# Patient Record
Sex: Male | Born: 1985 | Race: White | Hispanic: No | Marital: Married | Smoking: Current every day smoker
Health system: Southern US, Community
[De-identification: ages and names within clinical notes are randomized; demographics above are authoritative.]

## PROBLEM LIST (undated history)

## (undated) DIAGNOSIS — F32A Depression, unspecified: Secondary | ICD-10-CM

## (undated) DIAGNOSIS — F419 Anxiety disorder, unspecified: Secondary | ICD-10-CM

## (undated) DIAGNOSIS — F319 Bipolar disorder, unspecified: Secondary | ICD-10-CM

## (undated) DIAGNOSIS — K219 Gastro-esophageal reflux disease without esophagitis: Secondary | ICD-10-CM

## (undated) DIAGNOSIS — F1020 Alcohol dependence, uncomplicated: Secondary | ICD-10-CM

## (undated) DIAGNOSIS — R519 Headache, unspecified: Secondary | ICD-10-CM

## (undated) DIAGNOSIS — F329 Major depressive disorder, single episode, unspecified: Secondary | ICD-10-CM

## (undated) DIAGNOSIS — R51 Headache: Secondary | ICD-10-CM

## (undated) DIAGNOSIS — R011 Cardiac murmur, unspecified: Secondary | ICD-10-CM

## (undated) DIAGNOSIS — G4730 Sleep apnea, unspecified: Secondary | ICD-10-CM

## (undated) DIAGNOSIS — Z860100 Personal history of colon polyps, unspecified: Secondary | ICD-10-CM

## (undated) DIAGNOSIS — M1990 Unspecified osteoarthritis, unspecified site: Secondary | ICD-10-CM

## (undated) HISTORY — PX: INNER EAR SURGERY: SHX679

## (undated) HISTORY — DX: Anxiety disorder, unspecified: F41.9

## (undated) HISTORY — DX: Major depressive disorder, single episode, unspecified: F32.9

## (undated) HISTORY — DX: Alcohol dependence, uncomplicated: F10.20

## (undated) HISTORY — DX: Sleep apnea, unspecified: G47.30

## (undated) HISTORY — PX: STOMACH SURGERY: SHX791

## (undated) HISTORY — DX: Unspecified osteoarthritis, unspecified site: M19.90

## (undated) HISTORY — DX: Gastro-esophageal reflux disease without esophagitis: K21.9

## (undated) HISTORY — DX: Depression, unspecified: F32.A

## (undated) HISTORY — DX: Personal history of colonic polyps: Z86.010

## (undated) HISTORY — DX: Personal history of colon polyps, unspecified: Z86.0100

## (undated) HISTORY — PX: TONSILLECTOMY: SUR1361

---

## 2016-07-03 LAB — HM COLONOSCOPY

## 2017-09-07 ENCOUNTER — Ambulatory Visit (INDEPENDENT_AMBULATORY_CARE_PROVIDER_SITE_OTHER): Payer: Self-pay

## 2017-09-07 ENCOUNTER — Ambulatory Visit (INDEPENDENT_AMBULATORY_CARE_PROVIDER_SITE_OTHER): Payer: 59 | Admitting: Orthopaedic Surgery

## 2017-09-07 ENCOUNTER — Encounter (INDEPENDENT_AMBULATORY_CARE_PROVIDER_SITE_OTHER): Payer: Self-pay | Admitting: Orthopaedic Surgery

## 2017-09-07 VITALS — BP 133/73 | HR 70 | Ht 72.0 in | Wt 225.0 lb

## 2017-09-07 DIAGNOSIS — M542 Cervicalgia: Secondary | ICD-10-CM | POA: Diagnosis not present

## 2017-09-07 DIAGNOSIS — G8929 Other chronic pain: Secondary | ICD-10-CM | POA: Diagnosis not present

## 2017-09-07 DIAGNOSIS — M5116 Intervertebral disc disorders with radiculopathy, lumbar region: Secondary | ICD-10-CM

## 2017-09-07 DIAGNOSIS — M545 Low back pain: Secondary | ICD-10-CM

## 2017-09-10 ENCOUNTER — Encounter (INDEPENDENT_AMBULATORY_CARE_PROVIDER_SITE_OTHER): Payer: Self-pay | Admitting: Orthopaedic Surgery

## 2017-09-10 DIAGNOSIS — M5116 Intervertebral disc disorders with radiculopathy, lumbar region: Secondary | ICD-10-CM | POA: Insufficient documentation

## 2017-09-10 NOTE — Progress Notes (Signed)
Office Visit Note   Patient: Jeremiah Flores           Date of Birth: [birth year]           MRN: [id number] Visit Date: 09/07/2017              Requested by: No referring provider defined for this encounter. PCP: Patient, No Pcp Per   Assessment & Plan: Visit Diagnoses:  1. Neck pain   2. Chronic right-sided low back pain, with sciatica presence unspecified     Plan: Microdiscectomy at L5-S1.  Will start on the right side and consider bilateral microdiscectomy at L5-S1  if needed.  Discussed operative technique overnight stay in the hospital ,walking program postoperatively ,and avoiding sitting for period of 6 weeks other than for meals are using the bathroom.  Activities discussed risks of recurrence discussed with the recurrence rate of 5-10%.  He understands he may get progression of the disc degeneration with time and there is a potential that at some point in the future fusion might be indicated although this is unlikely.  Questions were elicited and answered he understands and requests we proceed.  Follow-Up Instructions: No Follow-up on file.   Orders:  Orders Placed This Encounter  Procedures  . XR Lumbar Spine 2-3 Views  . XR Cervical Spine 2 or 3 views   No orders of the defined types were placed in this encounter.     Procedures: No procedures performed   Clinical Data: No additional findings.   Subjective: Chief Complaint  Patient presents with  . Lower Back - Pain    HPI 32 year old male states he was in the [employer] for 4 years started having back pain during basic training on told that he was overweight needs to lose weight.  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  He was seen at [place] and was scheduled for microdiscectomy for a  L5-S1 HNP with radiculopathy.  He has had persistent problems with back pain and righ more than left leg weakness numbness and has problems with  stairs.  He is taking gabapentin 900 mg at night also ibuprofen and Naprosyn without relief.  He is had history of therapy, epidural injection, muscle relaxants, anti-inflammatories, gabapentin without relief.  Patient states he had to cancel his surgery since he was moving to [place].  He states he currently is on his husband's insurance and would like to proceed with surgical scheduling due to persistent radicular symptoms from his right L5-S1 HNP.  MRI scan 05/04/2017 showed extruded disc central disc herniation with moderate impression on thecal sac contributing to mild bilateral L5 neural foraminal narrowing 15 pages of [facility] from [facility] are reviewed. 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  Positive for anxiety, arthritis, bronchitis, depression, history of mononucleosis sleep apnea.  Previous surgeries include ear surgery 1999 ,stomach surgery 1988, tonsillectomy 2018.  He takes only praise all for GERD.  Is been on trazodone at night.  He also states he has had some neck problems off and on.  He had been followed previously at the [place].  Patient denies associated bowel or bladder symptoms.  No fever or chills.   Objective: Vital Signs: BP 133/73   Pulse 70   Ht 6' (1.829 m)   Wt 225 lb (102.1 kg)   BMI 30.52 kg/m  Physical Exam  Constitutional: He is oriented to person, place, and time. He appears well-developed and well-nourished.  HENT:  Head: Normocephalic and atraumatic.  Eyes: EOM are normal. Pupils are equal, round, and reactive to light.  Neck: No tracheal deviation present. No thyromegaly present.  Cardiovascular: Normal rate.  Pulmonary/Chest: Effort normal. He has no wheezes.  Abdominal: Soft. Bowel sounds are normal.  Neurological: He is alert and oriented to person, place, and time.  Skin: Skin is warm and dry. Capillary refill takes less than 2 seconds.  Psychiatric: He has a normal mood and  affect. His behavior is normal. Judgment and thought content normal.    Ortho Exam patient has positive straight leg raising 70 degrees on the right.  Knee and ankle jerk are 1+ bilateral negative for clonus.  No brachial plexus tenderness.  More right than left sciatic notch tenderness.  Hamstrings quads are strong.  Normal hip range of motion.  He has weakness with toe walking on the right but can do it on the left.  Specialty Comments:  No specialty comments available.  Imaging: Lumbar MRI scan 05/04/2017 shows extruded disc fragment centrally at L5-S1 with moderate impression on the thecal sac medial to the S1 nerve root origins.  Disc bulge causes bilateral narrowing of the foramina.  Disc at L1-2 through L4-5 are normal.  Shallow central protrusion at T12-L1 without compression.   PMFS History: There are no active problems to display for this patient.  Past Medical History:  Diagnosis Date  . Acid reflux   . Alcoholism ([place])   . Anxiety   . Arthritis   . Depression   . Sleep apnea     History reviewed. No pertinent family history.  Past Surgical History:  Procedure Laterality Date  . TONSILLECTOMY     Social History   Occupational History  . Not on file  Tobacco Use  . Smoking status: Current Every Day Smoker    Years: 9.00  . Smokeless tobacco: Never Used  Substance and Sexual Activity  . Alcohol use: No    Frequency: Never  . Drug use: No  . Sexual activity: Not on file

## 2017-09-11 NOTE — Pre-Procedure Instructions (Signed)
Jeremiah Flores  09/11/2017      [id number] [address], [address] [place] [address] Phone: [phone] Fax: [phone]    Your procedure is scheduled on Monday March 18.  Report to [facility] Admitting at 1:25 P.M.  Call this number if you have problems the morning of surgery:  [phone]   Remember:  Do not eat food or drink liquids after midnight.  Take these medicines the morning of surgery with A SIP OF WATER:   Omeprazole (prilosec) Vilazodone (Viibryd) Baclofen (lioresal) if needed  7 days prior to surgery STOP taking any Aspirin(unless otherwise instructed by your surgeon), diclofenac (voltaren), Aleve, Naproxen, Ibuprofen, Motrin, Advil, Goody's, BC's, all herbal medications, fish oil, and all vitamins    Do not wear jewelry, make-up or nail polish.  Do not wear lotions, powders, or perfumes, or deodorant.  Do not shave 48 hours prior to surgery.  Men may shave face and neck.  Do not bring valuables to the hospital.  [facility] is not responsible for any belongings or valuables.  Contacts, dentures or bridgework may not be worn into surgery.  Leave your suitcase in the car.  After surgery it may be brought to your room.  For patients admitted to the hospital, discharge time will be determined by your treatment team.  Patients discharged the day of surgery will not be allowed to drive home.   Special instructions:    [facility]- Preparing For Surgery  Before surgery, you can play an important role. Because skin is not sterile, your skin needs to be as free of germs as possible. You can reduce the number of germs on your skin by washing with CHG (chlorahexidine gluconate) Soap before surgery.  CHG is an antiseptic cleaner which kills germs and bonds with the skin to continue killing germs even after washing.  Please do not use if you have an allergy to CHG or antibacterial soaps. If your skin  becomes reddened/irritated stop using the CHG.  Do not shave (including legs and underarms) for at least 48 hours prior to first CHG shower. It is OK to shave your face.  Please follow these instructions carefully.   1. Shower the NIGHT BEFORE SURGERY and the MORNING OF SURGERY with CHG.   2. If you chose to wash your hair, wash your hair first as usual with your normal shampoo.  3. After you shampoo, rinse your hair and body thoroughly to remove the shampoo.  4. Use CHG as you would any other liquid soap. You can apply CHG directly to the skin and wash gently with a scrungie or a clean washcloth.   5. Apply the CHG Soap to your body ONLY FROM THE NECK DOWN.  Do not use on open wounds or open sores. Avoid contact with your eyes, ears, mouth and genitals (private parts). Wash Face and genitals (private parts)  with your normal soap.  6. Wash thoroughly, paying special attention to the area where your surgery will be performed.  7. Thoroughly rinse your body with warm water from the neck down.  8. DO NOT shower/wash with your normal soap after using and rinsing off the CHG Soap.  9. Pat yourself dry with a CLEAN TOWEL.  10. Wear CLEAN PAJAMAS to bed the night before surgery, wear comfortable clothes the morning of surgery  11. Place CLEAN SHEETS on your bed the night of your first shower and DO NOT SLEEP WITH PETS.  Day of Surgery: Do not apply any deodorants/lotions. Please wear clean clothes to the hospital/surgery center.      Please read over the following fact sheets that you were given. Coughing and Deep Breathing, MRSA Information and Surgical Site Infection Prevention

## 2017-09-12 ENCOUNTER — Encounter (HOSPITAL_COMMUNITY)
Admission: RE | Admit: 2017-09-12 | Discharge: 2017-09-12 | Disposition: A | Discharge status: Home / Self Care | Payer: 59 | Source: Ambulatory Visit | Attending: Orthopaedic Surgery | Admitting: Orthopaedic Surgery

## 2017-09-12 ENCOUNTER — Other Ambulatory Visit: Payer: Self-pay

## 2017-09-12 ENCOUNTER — Encounter (HOSPITAL_COMMUNITY): Payer: Self-pay

## 2017-09-12 DIAGNOSIS — Z01812 Encounter for preprocedural laboratory examination: Secondary | ICD-10-CM | POA: Diagnosis present

## 2017-09-12 HISTORY — DX: Bipolar disorder, unspecified: F31.9

## 2017-09-12 HISTORY — DX: Headache: R51

## 2017-09-12 HISTORY — DX: Headache, unspecified: R51.9

## 2017-09-12 HISTORY — DX: Cardiac murmur, unspecified: R01.1

## 2017-09-12 LAB — COMPREHENSIVE METABOLIC PANEL
ALBUMIN: 4.3 g/dL (ref 3.5–5.0)
ALK PHOS: 48 U/L (ref 38–126)
ALT: 76 U/L — ABNORMAL HIGH (ref 17–63)
AST: 42 U/L — ABNORMAL HIGH (ref 15–41)
Anion gap: 12 (ref 5–15)
BILIRUBIN TOTAL: 0.5 mg/dL (ref 0.3–1.2)
BUN: 13 mg/dL (ref 6–20)
CALCIUM: 9.5 mg/dL (ref 8.9–10.3)
CO2: 22 mmol/L (ref 22–32)
Chloride: 106 mmol/L (ref 101–111)
Creatinine, Ser: 1.03 mg/dL (ref 0.61–1.24)
GFR calc non Af Amer: >60 mL/min (ref >60)
GLUCOSE: 132 mg/dL — AB (ref 65–99)
Potassium: 4 mmol/L (ref 3.5–5.1)
SODIUM: 140 mmol/L (ref 135–145)
Total Protein: 6.8 g/dL (ref 6.5–8.1)

## 2017-09-12 LAB — CBC
HEMATOCRIT: 44.5 % (ref 39.0–52.0)
HEMOGLOBIN: 15.4 g/dL (ref 13.0–17.0)
MCH: 29.8 pg (ref 26.0–34.0)
MCHC: 34.6 g/dL (ref 30.0–36.0)
MCV: 86.1 fL (ref 78.0–100.0)
Platelets: 316 10*3/uL (ref 150–400)
RBC: 5.17 MIL/uL (ref 4.22–5.81)
RDW: 12.5 % (ref 11.5–15.5)
WBC: 10.1 10*3/uL (ref 4.0–10.5)

## 2017-09-12 LAB — SURGICAL PCR SCREEN
MRSA, PCR: NEGATIVE
Staphylococcus aureus: NEGATIVE

## 2017-09-12 NOTE — Pre-Procedure Instructions (Signed)
UPTON RUSSEY  09/12/2017      [id number] - [address], [address] [place] [address] Phone: [phone] Fax: [phone]    Your procedure is scheduled on Monday March 18.  Report to [facility] Admitting at 1030 A.M.  Call this number if you have problems the morning of surgery:  [phone]   Remember:  Do not eat food or drink liquids after midnight.  Take these medicines the morning of surgery with A SIP OF WATER:   Omeprazole (prilosec) Vilazodone (Viibryd) Baclofen (lioresal) if needed  7 days prior to surgery STOP taking any Aspirin(unless otherwise instructed by your surgeon), diclofenac (voltaren), Aleve, Naproxen, Ibuprofen, Motrin, Advil, Goody's, BC's, all herbal medications, fish oil, and all vitamins    Do not wear jewelry, make-up or nail polish.  Do not wear lotions, powders, or perfumes, or deodorant.  Do not shave 48 hours prior to surgery.  Men may shave face and neck.  Do not bring valuables to the hospital.  [facility] is not responsible for any belongings or valuables.  Contacts, dentures or bridgework may not be worn into surgery.  Leave your suitcase in the car.  After surgery it may be brought to your room.  For patients admitted to the hospital, discharge time will be determined by your treatment team.  Patients discharged the day of surgery will not be allowed to drive home.   Special instructions:    [facility]- Preparing For Surgery  Before surgery, you can play an important role. Because skin is not sterile, your skin needs to be as free of germs as possible. You can reduce the number of germs on your skin by washing with CHG (chlorahexidine gluconate) Soap before surgery.  CHG is an antiseptic cleaner which kills germs and bonds with the skin to continue killing germs even after washing.  Please do not use if you have an allergy to CHG or antibacterial soaps. If your skin  becomes reddened/irritated stop using the CHG.  Do not shave (including legs and underarms) for at least 48 hours prior to first CHG shower. It is OK to shave your face.  Please follow these instructions carefully.   1. Shower the NIGHT BEFORE SURGERY and the MORNING OF SURGERY with CHG.   2. If you chose to wash your hair, wash your hair first as usual with your normal shampoo.  3. After you shampoo, rinse your hair and body thoroughly to remove the shampoo.  4. Use CHG as you would any other liquid soap. You can apply CHG directly to the skin and wash gently with a scrungie or a clean washcloth.   5. Apply the CHG Soap to your body ONLY FROM THE NECK DOWN.  Do not use on open wounds or open sores. Avoid contact with your eyes, ears, mouth and genitals (private parts). Wash Face and genitals (private parts)  with your normal soap.  6. Wash thoroughly, paying special attention to the area where your surgery will be performed.  7. Thoroughly rinse your body with warm water from the neck down.  8. DO NOT shower/wash with your normal soap after using and rinsing off the CHG Soap.  9. Pat yourself dry with a CLEAN TOWEL.  10. Wear CLEAN PAJAMAS to bed the night before surgery, wear comfortable clothes the morning of surgery  11. Place CLEAN SHEETS on your bed the night of your first shower and DO NOT SLEEP WITH  PETS.    Day of Surgery: Do not apply any deodorants/lotions. Please wear clean clothes to the hospital/surgery center.      Please read over the following fact sheets that you were given. Coughing and Deep Breathing, MRSA Information and Surgical Site Infection Prevention

## 2017-09-17 ENCOUNTER — Ambulatory Visit (HOSPITAL_COMMUNITY): Payer: 59 | Admitting: Anesthesiology

## 2017-09-17 ENCOUNTER — Ambulatory Visit (HOSPITAL_COMMUNITY): Payer: 59 | Admitting: Emergency Medicine

## 2017-09-17 ENCOUNTER — Other Ambulatory Visit: Payer: Self-pay

## 2017-09-17 ENCOUNTER — Encounter (HOSPITAL_COMMUNITY)
Admission: RE | Disposition: A | Discharge status: Home / Self Care | Payer: Self-pay | Source: Ambulatory Visit | Attending: Orthopaedic Surgery

## 2017-09-17 ENCOUNTER — Observation Stay (HOSPITAL_COMMUNITY)
Admission: RE | Admit: 2017-09-17 | Discharge: 2017-09-18 | Disposition: A | Discharge status: Home / Self Care | Payer: 59 | Source: Ambulatory Visit | Attending: Orthopaedic Surgery | Admitting: Orthopaedic Surgery

## 2017-09-17 ENCOUNTER — Ambulatory Visit (HOSPITAL_COMMUNITY): Payer: 59

## 2017-09-17 ENCOUNTER — Encounter (HOSPITAL_COMMUNITY): Payer: Self-pay

## 2017-09-17 DIAGNOSIS — F319 Bipolar disorder, unspecified: Secondary | ICD-10-CM | POA: Diagnosis not present

## 2017-09-17 DIAGNOSIS — K219 Gastro-esophageal reflux disease without esophagitis: Secondary | ICD-10-CM | POA: Diagnosis not present

## 2017-09-17 DIAGNOSIS — M5126 Other intervertebral disc displacement, lumbar region: Secondary | ICD-10-CM | POA: Diagnosis present

## 2017-09-17 DIAGNOSIS — Z79899 Other long term (current) drug therapy: Secondary | ICD-10-CM | POA: Diagnosis not present

## 2017-09-17 DIAGNOSIS — M5117 Intervertebral disc disorders with radiculopathy, lumbosacral region: Secondary | ICD-10-CM | POA: Diagnosis not present

## 2017-09-17 DIAGNOSIS — F419 Anxiety disorder, unspecified: Secondary | ICD-10-CM | POA: Insufficient documentation

## 2017-09-17 DIAGNOSIS — Z791 Long term (current) use of non-steroidal anti-inflammatories (NSAID): Secondary | ICD-10-CM | POA: Diagnosis not present

## 2017-09-17 DIAGNOSIS — M5137 Other intervertebral disc degeneration, lumbosacral region: Secondary | ICD-10-CM | POA: Diagnosis not present

## 2017-09-17 DIAGNOSIS — G4730 Sleep apnea, unspecified: Secondary | ICD-10-CM | POA: Diagnosis not present

## 2017-09-17 DIAGNOSIS — F17200 Nicotine dependence, unspecified, uncomplicated: Secondary | ICD-10-CM | POA: Insufficient documentation

## 2017-09-17 DIAGNOSIS — Z419 Encounter for procedure for purposes other than remedying health state, unspecified: Secondary | ICD-10-CM

## 2017-09-17 HISTORY — PX: LUMBAR LAMINECTOMY/DECOMPRESSION MICRODISCECTOMY: SHX5026

## 2017-09-17 SURGERY — LUMBAR LAMINECTOMY/DECOMPRESSION MICRODISCECTOMY
Anesthesia: General

## 2017-09-17 MED ORDER — CHLORHEXIDINE GLUCONATE 4 % EX LIQD: 60.0000 mL | Freq: Once | CUTANEOUS | Status: DC

## 2017-09-17 MED ORDER — PROPOFOL 10 MG/ML IV BOLUS
INTRAVENOUS | Status: DC | PRN
  Administered 2017-09-17: 200 mg via INTRAVENOUS

## 2017-09-17 MED ORDER — BUPIVACAINE HCL (PF) 0.25 % IJ SOLN
INTRAMUSCULAR | Status: DC | PRN
  Administered 2017-09-17: 5 mL

## 2017-09-17 MED ORDER — SODIUM CHLORIDE 0.9 % IV SOLN: INTRAVENOUS | Status: DC

## 2017-09-17 MED ORDER — BUPIVACAINE HCL (PF) 0.25 % IJ SOLN
INTRAMUSCULAR | Status: AC
  Filled 2017-09-17: qty 30

## 2017-09-17 MED ORDER — HYDROMORPHONE HCL 1 MG/ML IJ SOLN
INTRAMUSCULAR | Status: AC
  Administered 2017-09-17: 0.5 mg via INTRAVENOUS
  Filled 2017-09-17: qty 1

## 2017-09-17 MED ORDER — 0.9 % SODIUM CHLORIDE (POUR BTL) OPTIME
TOPICAL | Status: DC | PRN
  Administered 2017-09-17: 1000 mL

## 2017-09-17 MED ORDER — TRAZODONE HCL 100 MG PO TABS
100.0000 mg | ORAL_TABLET | Freq: Every day | ORAL | Status: DC
  Administered 2017-09-17: 100 mg via ORAL
  Filled 2017-09-17: qty 1

## 2017-09-17 MED ORDER — MIDAZOLAM HCL 2 MG/2ML IJ SOLN
INTRAMUSCULAR | Status: AC
  Filled 2017-09-17: qty 2

## 2017-09-17 MED ORDER — HYDROMORPHONE HCL 1 MG/ML IJ SOLN
0.2500 mg | INTRAMUSCULAR | Status: DC | PRN
  Administered 2017-09-17 (×2): 0.5 mg via INTRAVENOUS

## 2017-09-17 MED ORDER — LIDOCAINE 2% (20 MG/ML) 5 ML SYRINGE
INTRAMUSCULAR | Status: DC | PRN
  Administered 2017-09-17: 60 mg via INTRAVENOUS

## 2017-09-17 MED ORDER — LACTATED RINGERS IV SOLN
INTRAVENOUS | Status: DC
  Administered 2017-09-17 (×3): via INTRAVENOUS

## 2017-09-17 MED ORDER — METHOCARBAMOL 1000 MG/10ML IJ SOLN
500.0000 mg | Freq: Four times a day (QID) | INTRAVENOUS | Status: DC | PRN
  Filled 2017-09-17: qty 5

## 2017-09-17 MED ORDER — DEXAMETHASONE SODIUM PHOSPHATE 10 MG/ML IJ SOLN
INTRAMUSCULAR | Status: DC | PRN
  Administered 2017-09-17: 10 mg via INTRAVENOUS

## 2017-09-17 MED ORDER — ACETAMINOPHEN 650 MG RE SUPP: 650.0000 mg | RECTAL | Status: DC | PRN

## 2017-09-17 MED ORDER — MIDAZOLAM HCL 5 MG/5ML IJ SOLN
INTRAMUSCULAR | Status: DC | PRN
  Administered 2017-09-17: 2 mg via INTRAVENOUS

## 2017-09-17 MED ORDER — SODIUM CHLORIDE 0.9% FLUSH
3.0000 mL | Freq: Two times a day (BID) | INTRAVENOUS | Status: DC
  Administered 2017-09-17: 3 mL via INTRAVENOUS

## 2017-09-17 MED ORDER — FENTANYL CITRATE (PF) 250 MCG/5ML IJ SOLN
INTRAMUSCULAR | Status: AC
  Filled 2017-09-17: qty 5

## 2017-09-17 MED ORDER — CEFAZOLIN SODIUM 1 G IJ SOLR
INTRAMUSCULAR | Status: AC
  Filled 2017-09-17: qty 60

## 2017-09-17 MED ORDER — CEFAZOLIN SODIUM-DEXTROSE 2-4 GM/100ML-% IV SOLN
2.0000 g | INTRAVENOUS | Status: AC
  Administered 2017-09-17: 2 g via INTRAVENOUS
  Filled 2017-09-17: qty 100

## 2017-09-17 MED ORDER — STERILE WATER FOR IRRIGATION IR SOLN
Status: DC | PRN
  Administered 2017-09-17: 1000 mL

## 2017-09-17 MED ORDER — PANTOPRAZOLE SODIUM 40 MG PO TBEC
40.0000 mg | DELAYED_RELEASE_TABLET | Freq: Every day | ORAL | Status: DC
  Administered 2017-09-18: 40 mg via ORAL
  Filled 2017-09-17: qty 1

## 2017-09-17 MED ORDER — BUPIVACAINE-EPINEPHRINE (PF) 0.5% -1:200000 IJ SOLN: INTRAMUSCULAR | Status: DC | PRN

## 2017-09-17 MED ORDER — ROCURONIUM BROMIDE 10 MG/ML (PF) SYRINGE
PREFILLED_SYRINGE | INTRAVENOUS | Status: DC | PRN
  Administered 2017-09-17: 50 mg via INTRAVENOUS

## 2017-09-17 MED ORDER — OXYCODONE HCL 5 MG PO TABS
ORAL_TABLET | ORAL | Status: AC
  Filled 2017-09-17: qty 1

## 2017-09-17 MED ORDER — GABAPENTIN 300 MG PO CAPS
900.0000 mg | ORAL_CAPSULE | Freq: Every day | ORAL | Status: DC
  Administered 2017-09-17: 900 mg via ORAL
  Filled 2017-09-17: qty 3

## 2017-09-17 MED ORDER — QUETIAPINE 12.5 MG HALF TABLET
12.5000 mg | ORAL_TABLET | Freq: Every evening | ORAL | Status: DC | PRN
  Filled 2017-09-17: qty 1

## 2017-09-17 MED ORDER — SUGAMMADEX SODIUM 200 MG/2ML IV SOLN
INTRAVENOUS | Status: DC | PRN
  Administered 2017-09-17: 200 mg via INTRAVENOUS

## 2017-09-17 MED ORDER — MENTHOL 3 MG MT LOZG: 1.0000 | LOZENGE | OROMUCOSAL | Status: DC | PRN

## 2017-09-17 MED ORDER — PROPOFOL 10 MG/ML IV BOLUS
INTRAVENOUS | Status: AC
  Filled 2017-09-17: qty 40

## 2017-09-17 MED ORDER — CEFAZOLIN SODIUM-DEXTROSE 1-4 GM/50ML-% IV SOLN
1.0000 g | Freq: Three times a day (TID) | INTRAVENOUS | Status: AC
  Administered 2017-09-17 – 2017-09-18 (×2): 1 g via INTRAVENOUS
  Filled 2017-09-17 (×2): qty 50

## 2017-09-17 MED ORDER — SODIUM CHLORIDE 0.9% FLUSH: 3.0000 mL | INTRAVENOUS | Status: DC | PRN

## 2017-09-17 MED ORDER — ONDANSETRON HCL 4 MG PO TABS: 4.0000 mg | ORAL_TABLET | Freq: Four times a day (QID) | ORAL | Status: DC | PRN

## 2017-09-17 MED ORDER — METHOCARBAMOL 500 MG PO TABS
500.0000 mg | ORAL_TABLET | Freq: Four times a day (QID) | ORAL | Status: DC | PRN
  Administered 2017-09-17 (×2): 500 mg via ORAL
  Filled 2017-09-17: qty 1

## 2017-09-17 MED ORDER — OXYCODONE-ACETAMINOPHEN 5-325 MG PO TABS
1.0000 | ORAL_TABLET | ORAL | Status: DC | PRN
  Administered 2017-09-17 – 2017-09-18 (×3): 2 via ORAL
  Filled 2017-09-17 (×3): qty 2

## 2017-09-17 MED ORDER — ACETAMINOPHEN 325 MG PO TABS: 650.0000 mg | ORAL_TABLET | ORAL | Status: DC | PRN

## 2017-09-17 MED ORDER — METHOCARBAMOL 500 MG PO TABS
ORAL_TABLET | ORAL | Status: AC
  Administered 2017-09-17: 500 mg via ORAL
  Filled 2017-09-17: qty 1

## 2017-09-17 MED ORDER — SUGAMMADEX SODIUM 200 MG/2ML IV SOLN
INTRAVENOUS | Status: AC
Start: 2017-09-17 — End: ?
  Filled 2017-09-17: qty 2

## 2017-09-17 MED ORDER — PHENOL 1.4 % MT LIQD: 1.0000 | OROMUCOSAL | Status: DC | PRN

## 2017-09-17 MED ORDER — HYDROMORPHONE HCL 1 MG/ML IJ SOLN
0.5000 mg | INTRAMUSCULAR | Status: DC | PRN
  Administered 2017-09-17: 1 mg via INTRAVENOUS
  Filled 2017-09-17: qty 1

## 2017-09-17 MED ORDER — DOCUSATE SODIUM 100 MG PO CAPS
100.0000 mg | ORAL_CAPSULE | Freq: Two times a day (BID) | ORAL | Status: DC
  Administered 2017-09-17 – 2017-09-18 (×2): 100 mg via ORAL
  Filled 2017-09-17 (×2): qty 1

## 2017-09-17 MED ORDER — FENTANYL CITRATE (PF) 250 MCG/5ML IJ SOLN
INTRAMUSCULAR | Status: DC | PRN
  Administered 2017-09-17 (×3): 50 ug via INTRAVENOUS
  Administered 2017-09-17: 100 ug via INTRAVENOUS

## 2017-09-17 MED ORDER — OXYCODONE HCL 5 MG PO TABS
5.0000 mg | ORAL_TABLET | ORAL | Status: DC | PRN
  Administered 2017-09-17: 5 mg via ORAL

## 2017-09-17 MED ORDER — VILAZODONE HCL 20 MG PO TABS
20.0000 mg | ORAL_TABLET | Freq: Every day | ORAL | Status: DC
  Administered 2017-09-18: 20 mg via ORAL
  Filled 2017-09-17: qty 1

## 2017-09-17 MED ORDER — ONDANSETRON HCL 4 MG/2ML IJ SOLN
INTRAMUSCULAR | Status: DC | PRN
  Administered 2017-09-17: 4 mg via INTRAVENOUS

## 2017-09-17 MED ORDER — ONDANSETRON HCL 4 MG/2ML IJ SOLN: 4.0000 mg | Freq: Four times a day (QID) | INTRAMUSCULAR | Status: DC | PRN

## 2017-09-17 SURGICAL SUPPLY — 40 items
BUR ROUND FLUTED 4 SOFT TCH (BURR) IMPLANT
CANISTER SUCT 3000ML PPV (MISCELLANEOUS) ×2 IMPLANT
CLSR STERI-STRIP ANTIMIC 1/2X4 (GAUZE/BANDAGES/DRESSINGS) ×2 IMPLANT
COVER SURGICAL LIGHT HANDLE (MISCELLANEOUS) ×2 IMPLANT
DECANTER SPIKE VIAL GLASS SM (MISCELLANEOUS) ×2 IMPLANT
DRAPE HALF SHEET 40X57 (DRAPES) ×4 IMPLANT
DRAPE MICROSCOPE LEICA (MISCELLANEOUS) ×2 IMPLANT
DRAPE SURG 17X23 STRL (DRAPES) ×2 IMPLANT
DRSG MEPILEX BORDER 4X4 (GAUZE/BANDAGES/DRESSINGS) ×2 IMPLANT
DURAPREP 26ML APPLICATOR (WOUND CARE) ×2 IMPLANT
ELECT REM PT RETURN 9FT ADLT (ELECTROSURGICAL) ×2
ELECTRODE REM PT RTRN 9FT ADLT (ELECTROSURGICAL) ×1 IMPLANT
GLOVE BIOGEL PI IND STRL 8 (GLOVE) ×2 IMPLANT
GLOVE BIOGEL PI INDICATOR 8 (GLOVE) ×2
GLOVE ORTHO TXT STRL SZ7.5 (GLOVE) ×4 IMPLANT
GOWN STRL REUS W/ TWL LRG LVL3 (GOWN DISPOSABLE) ×2 IMPLANT
GOWN STRL REUS W/ TWL XL LVL3 (GOWN DISPOSABLE) ×1 IMPLANT
GOWN STRL REUS W/TWL 2XL LVL3 (GOWN DISPOSABLE) ×2 IMPLANT
GOWN STRL REUS W/TWL LRG LVL3 (GOWN DISPOSABLE) ×2
GOWN STRL REUS W/TWL XL LVL3 (GOWN DISPOSABLE) ×1
KIT BASIN OR (CUSTOM PROCEDURE TRAY) ×2 IMPLANT
KIT ROOM TURNOVER OR (KITS) ×2 IMPLANT
MANIFOLD NEPTUNE II (INSTRUMENTS) ×2 IMPLANT
MATRIX HEMOSTAT SURGIFLO (HEMOSTASIS) ×2 IMPLANT
NEEDLE HYPO 25GX1X1/2 BEV (NEEDLE) ×2 IMPLANT
NEEDLE SPNL 18GX3.5 QUINCKE PK (NEEDLE) ×2 IMPLANT
NS IRRIG 1000ML POUR BTL (IV SOLUTION) ×2 IMPLANT
PACK LAMINECTOMY ORTHO (CUSTOM PROCEDURE TRAY) ×2 IMPLANT
PAD ARMBOARD 7.5X6 YLW CONV (MISCELLANEOUS) ×4 IMPLANT
PATTIES SURGICAL .5 X.5 (GAUZE/BANDAGES/DRESSINGS) IMPLANT
PATTIES SURGICAL .75X.75 (GAUZE/BANDAGES/DRESSINGS) IMPLANT
SUT VIC AB 0 CT1 27 (SUTURE)
SUT VIC AB 0 CT1 27XBRD ANBCTR (SUTURE) IMPLANT
SUT VIC AB 1 CTX 36 (SUTURE) ×1
SUT VIC AB 1 CTX36XBRD ANBCTR (SUTURE) ×1 IMPLANT
SUT VIC AB 2-0 CT1 27 (SUTURE) ×1
SUT VIC AB 2-0 CT1 TAPERPNT 27 (SUTURE) ×1 IMPLANT
SUT VIC AB 3-0 X1 27 (SUTURE) ×2 IMPLANT
TOWEL OR 17X24 6PK STRL BLUE (TOWEL DISPOSABLE) ×2 IMPLANT
TOWEL OR 17X26 10 PK STRL BLUE (TOWEL DISPOSABLE) ×2 IMPLANT

## 2017-09-17 NOTE — Anesthesia Preprocedure Evaluation (Addendum)
Anesthesia Evaluation  Patient identified by MRN, date of birth, ID band Patient awake    Reviewed: Allergy & Precautions, H&P , NPO status , Patient's Chart, lab work & pertinent test results  Airway Mallampati: II  TM Distance: >3 FB Neck ROM: Full    Dental no notable dental hx. (+) Teeth Intact, Dental Advisory Given   Pulmonary sleep apnea and Continuous Positive Airway Pressure Ventilation , Current Smoker,    Pulmonary exam normal breath sounds clear to auscultation       Cardiovascular negative cardio ROS   Rhythm:Regular Rate:Normal     Neuro/Psych  Headaches, Anxiety Depression Bipolar Disorder    GI/Hepatic Neg liver ROS, GERD  Medicated and Controlled,  Endo/Other  negative endocrine ROS  Renal/GU negative Renal ROS  negative genitourinary   Musculoskeletal  (+) Arthritis , Osteoarthritis,    Abdominal   Peds  Hematology negative hematology ROS (+)   Anesthesia Other Findings   Reproductive/Obstetrics negative OB ROS                            Anesthesia Physical Anesthesia Plan  ASA: III  Anesthesia Plan: General   Post-op Pain Management:    Induction: Intravenous  PONV Risk Score and Plan: 2 and Ondansetron, Dexamethasone and Midazolam  Airway Management Planned: Oral ETT  Additional Equipment:   Intra-op Plan:   Post-operative Plan: Extubation in OR  Informed Consent: I have reviewed the patients History and Physical, chart, labs and discussed the procedure including the risks, benefits and alternatives for the proposed anesthesia with the patient or authorized representative who has indicated his/her understanding and acceptance.   Dental advisory given  Plan Discussed with: CRNA  Anesthesia Plan Comments:         Anesthesia Quick Evaluation

## 2017-09-17 NOTE — Transfer of Care (Signed)
Immediate Anesthesia Transfer of Care Note  Patient: Jeremiah Flores  Procedure(s) Performed: RIGHT L5-S1 MICRODISCECTOMY (N/A )  Patient Location: PACU  Anesthesia Type:General  Level of Consciousness: drowsy and patient cooperative  Airway & Oxygen Therapy: Patient Spontanous Breathing and Patient connected to nasal cannula oxygen  Post-op Assessment: Report given to RN and Post -op Vital signs reviewed and stable  Post vital signs: Reviewed and stable  Last Vitals:  Vitals:   09/17/17 1036 09/17/17 1445  BP: (!) 149/78   Pulse: 61 (P) 91  Resp: 20 (P) 13  Temp: 36.9 C (P) 36.8 C  SpO2: 97% (P) 97%    Last Pain:  Vitals:   09/17/17 1106  TempSrc:   PainSc: 0-No pain      Patients Stated Pain Goal: 3 (60/47/99 8721)  Complications: No apparent anesthesia complications

## 2017-09-17 NOTE — H&P (Signed)
Signed            [] Hide copied text  [] Hover for details     Office Visit Note/ History and PE              Patient: Jeremiah Flores                                        Date of Birth: 1986-04-30                                                     MRN: [id number] Visit Date: 09/07/2017                                                                     Requested by: No referring provider defined for this encounter. PCP: Patient, No Pcp Per   Assessment & Plan: Visit Diagnoses:  1. Neck pain   2. Chronic right-sided low back pain, with sciatica presence unspecified     Plan: Microdiscectomy at L5-S1.  Will start on the right side and consider bilateral microdiscectomy at L5-S1  if needed.  Discussed operative technique overnight stay in the hospital ,walking program postoperatively ,and avoiding sitting for period of 6 weeks other than for meals are using the bathroom.  Activities discussed risks of recurrence discussed with the recurrence rate of 5-10%.  He understands he may get progression of the disc degeneration with time and there is a potential that at some point in the future fusion might be indicated although this is unlikely.  Questions were elicited and answered he understands and requests we proceed.  Follow-Up Instructions: No Follow-up on file.   Orders:     Orders Placed This Encounter  Procedures  . XR Lumbar Spine 2-3 Views  . XR Cervical Spine 2 or 3 views   No orders of the defined types were placed in this encounter.     Procedures: No procedures performed   Clinical Data: No additional findings.   Subjective: Chief Complaint  Patient presents with  . Lower Back - Pain    HPI 32 year old male states he was in the [employer] for 4 years started having back pain during basic training on told that he was overweight needs to lose weight.  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  He was seen at [place] and was scheduled for microdiscectomy for a  L5-S1 HNP with radiculopathy.  He has had persistent problems with back pain and righ more than left leg weakness numbness and has problems with stairs.  He is taking gabapentin 900 mg at night also ibuprofen and Naprosyn without relief.  He is had history of therapy, epidural injection, muscle relaxants, anti-inflammatories, gabapentin without relief.  Patient states he had to cancel his surgery since he was moving to [place].  He states he currently is on his husband's insurance and would like to proceed with surgical  scheduling due to persistent radicular symptoms from his right L5-S1 HNP.  MRI scan 05/04/2017 showed extruded disc central disc herniation with moderate impression on thecal sac contributing to mild bilateral L5 neural foraminal narrowing 15 pages of notes from [facility] are reviewed. 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  Positive for anxiety, arthritis, bronchitis, depression, history of mononucleosis sleep apnea.  Previous surgeries include ear surgery 1999 ,stomach surgery 1988, tonsillectomy 2018.  He takes only praise all for GERD.  Is been on trazodone at night.  He also states he has had some neck problems off and on.  He had been followed previously at the [place].  Patient denies associated bowel or bladder symptoms.  No fever or chills.   Objective: Vital Signs: BP 133/73   Pulse 70   Ht 6' (1.829 m)   Wt 225 lb (102.1 kg)   BMI 30.52 kg/m   Physical Exam  Constitutional: He is oriented to person, place, and time. He appears well-developed and well-nourished.  HENT:  Head: Normocephalic and atraumatic.  Eyes: EOM are normal. Pupils are equal, round, and reactive to light.  Neck: No tracheal deviation present. No thyromegaly present.  Cardiovascular: Normal rate.  Pulmonary/Chest: Effort  normal. He has no wheezes.  Abdominal: Soft. Bowel sounds are normal.  Neurological: He is alert and oriented to person, place, and time.  Skin: Skin is warm and dry. Capillary refill takes less than 2 seconds.  Psychiatric: He has a normal mood and affect. His behavior is normal. Judgment and thought content normal.    Ortho Exam patient has positive straight leg raising 70 degrees on the right.  Knee and ankle jerk are 1+ bilateral negative for clonus.  No brachial plexus tenderness.  More right than left sciatic notch tenderness.  Hamstrings quads are strong.  Normal hip range of motion.  He has weakness with toe walking on the right but can do it on the left.  Specialty Comments:  No specialty comments available.  Imaging: Lumbar MRI scan 05/04/2017 shows extruded disc fragment centrally at L5-S1 with moderate impression on the thecal sac medial to the S1 nerve root origins.  Disc bulge causes bilateral narrowing of the foramina.  Disc at L1-2 through L4-5 are normal.  Shallow central protrusion at T12-L1 without compression.   PMFS History: There are no active problems to display for this patient.      Past Medical History:  Diagnosis Date  . Acid reflux   . Alcoholism ([place])   . Anxiety   . Arthritis   . Depression   . Sleep apnea     History reviewed. No pertinent family history.       Past Surgical History:  Procedure Laterality Date  . TONSILLECTOMY     Social History        Occupational History  . Not on file  Tobacco Use  . Smoking status: Current Every Day Smoker    Years: 9.00  . Smokeless tobacco: Never Used  Substance and Sexual Activity  . Alcohol use: No    Frequency: Never  . Drug use: No  . Sexual activity: Not on file

## 2017-09-17 NOTE — Interval H&P Note (Signed)
History and Physical Interval Note:  09/17/2017 12:16 PM  Jeremiah Flores  has presented today for surgery, with the diagnosis of L5-S1 HERNIATED NUCLEUS PULPOSUS  The various methods of treatment have been discussed with the patient and family. After consideration of risks, benefits and other options for treatment, the patient has consented to  Procedure(s): RIGHT L5-S1 MICRODISCECTOMY POSSIBLE BILATERAL (N/A) as a surgical intervention .  The patient's history has been reviewed, patient examined, no change in status, stable for surgery.  I have reviewed the patient's chart and labs.  Questions were answered to the patient's satisfaction.     Jeremiah Flores

## 2017-09-17 NOTE — Op Note (Signed)
Preop diagnosis: Right L5-S1 herniated nucleus pulposus  Postop diagnosis: Same  Procedure: Right L5-S1 microdiscectomy  Surgeon: Rodell Perna MD  Assistant: Benjiman Core PA-C medically necessary and present for the entire procedure  Anesthesia: General plus Marcaine local  EBL 101BP  Complications: None  Procedure after standard prepping draping with patient in prone position preoperative antibiotics timeout procedure DuraPrep dried area squared with towels Betadine Steri-Drape applied and laminectomy sheet and draped.  Needle localization with spinal needle was performed.  There is a delay in radiology arrival for portable imaging and incision was made based on palpable landmarks 1-2 mm to the right side at L5-S1.  Subperiosteal dissection onto the lamina self-retaining McCulloch retractor placed and a [place] 4 taken and placed at the interlaminar space between L5 and S1 confirmed with the lateral radiograph at the appropriate level.  Laminotomy was performed in the right at L5 a 4 mm bur was used to remove a portion of the overhanging facet and lateral wall.  Bone was removed at the level of the pedicle.  Thick chunks of ligamentum were removed.  Gentle retraction of the nerve and dura toward the midline with large disc protrusion present which was central and right paracentral.  Annulus was incised in multiple pieces of disc were teased out using the micropituitary and up up pituitary micro.  We progressed to regular pituitary use of the Epstein curette.  Chunks of disc removed and the D'Errico was used to push down on the disc space pushing fragments toward the midline of the disc and then removing them with the up-biting pituitary.  Once the chunks were decompressed dura was free disc was flat no longer protruding midline I was able to reach across with the D'Errico for the opposite wall with no areas of compression.  Patient had been permitted for possible bilateral microdiscectomy but with  good decompression on the right side and patient absent left-sided symptoms before the surgery we did right side only.  Nerve root was followed out underneath the pedicle and was loose.  No extruded fragments were present.  Some Surgi-Flo was placed in the lateral gutter and after a few minutes removed it was repeated once epidural space was dry.  Bipolar cautery was used on some epidural veins.  Standard layer closure with #1 Vicryl 2-0 Vicryl subcuticular skin closure tincture benzoin Steri-Strips after Dermabond had been applied and dressing.

## 2017-09-17 NOTE — Anesthesia Procedure Notes (Signed)
Procedure Name: Intubation Date/Time: 09/17/2017 1:07 PM Performed by: Renato Shin, CRNA Pre-anesthesia Checklist: Patient identified, Emergency Drugs available, Suction available and Patient being monitored Patient Re-evaluated:Patient Re-evaluated prior to induction Oxygen Delivery Method: Circle system utilized Preoxygenation: Pre-oxygenation with 100% oxygen Induction Type: IV induction Ventilation: Mask ventilation without difficulty Laryngoscope Size: Miller and 3 Grade View: Grade I Tube type: Oral Tube size: 7.5 mm Number of attempts: 1 Airway Equipment and Method: Stylet Placement Confirmation: ETT inserted through vocal cords under direct vision,  positive ETCO2 and CO2 detector Secured at: 21 cm Tube secured with: Tape Dental Injury: Teeth and Oropharynx as per pre-operative assessment

## 2017-09-18 ENCOUNTER — Encounter (HOSPITAL_COMMUNITY): Payer: Self-pay | Admitting: Orthopaedic Surgery

## 2017-09-18 DIAGNOSIS — M5117 Intervertebral disc disorders with radiculopathy, lumbosacral region: Secondary | ICD-10-CM | POA: Diagnosis not present

## 2017-09-18 MED ORDER — OXYCODONE-ACETAMINOPHEN 5-325 MG PO TABS: 1.0000 | ORAL_TABLET | ORAL | 0 refills | Status: DC | PRN

## 2017-09-18 NOTE — Progress Notes (Signed)
   Subjective: 1 Day Post-Op Procedure(s) (LRB): RIGHT L5-S1 MICRODISCECTOMY (N/A) Patient reports pain as mild.  " my leg pain is gone"  Objective: Vital signs in last 24 hours: Temp:  [97.7 F (36.5 C)-98.4 F (36.9 C)] 97.8 F (36.6 C) (03/19 0400) Pulse Rate:  [61-91] 71 (03/19 0400) Resp:  [8-20] 16 (03/19 0400) BP: (114-153)/(57-88) 114/57 (03/19 0400) SpO2:  [95 %-98 %] 96 % (03/19 0400) Weight:  [224 lb (101.6 kg)] 224 lb (101.6 kg) (03/18 1036)  Intake/Output from previous day: 03/18 0701 - 03/19 0700 In: 2080 [P.O.:480; I.V.:1600] Out: 100 [Blood:100] Intake/Output this shift: No intake/output data recorded.  No results for input(s): HGB in the last 72 hours. No results for input(s): WBC, RBC, HCT, PLT in the last 72 hours. No results for input(s): NA, K, CL, CO2, BUN, CREATININE, GLUCOSE, CALCIUM in the last 72 hours. No results for input(s): LABPT, INR in the last 72 hours.  Neurologically intact Dg Lumbar Spine 1 View  Result Date: 09/17/2017 CLINICAL DATA:  Lumbar microdiskectomy. EXAM: LUMBAR SPINE - 1 VIEW COMPARISON:  Lumbar radiographs 09/07/2017 FINDINGS: A single portable cross-table lateral radiograph of the lumbar spine is provided. Comparison radiographs demonstrate 5 non rib-bearing lumbar type vertebrae. The lowest fully formed intervertebral disc space is designated L5-S1. A metallic surgical instrument projects over the inferior aspect of the L5 spinous process, directed towards the L5-S1 disc space level. IMPRESSION: Intraoperative localization as above. Electronically Signed   By: Logan Bores M.D.   On: 09/17/2017 14:52    Assessment/Plan: 1 Day Post-Op Procedure(s) (LRB): RIGHT L5-S1 MICRODISCECTOMY (N/A) Plan: discharge home.   Marybelle Killings 09/18/2017, 7:50 AM

## 2017-09-18 NOTE — Discharge Instructions (Signed)
Ok to shower , office one week. Avoid sitting except for toilet and meals. Walk daily , goal of walking 2 miles

## 2017-09-18 NOTE — Anesthesia Postprocedure Evaluation (Signed)
Anesthesia Post Note  Patient: Jeremiah Flores  Procedure(s) Performed: RIGHT L5-S1 MICRODISCECTOMY (N/A )     Patient location during evaluation: PACU Anesthesia Type: General Level of consciousness: awake and alert Pain management: pain level controlled Vital Signs Assessment: post-procedure vital signs reviewed and stable Respiratory status: spontaneous breathing, nonlabored ventilation and respiratory function stable Cardiovascular status: blood pressure returned to baseline and stable Postop Assessment: no apparent nausea or vomiting Anesthetic complications: no    Last Vitals:  Vitals:   09/17/17 2320 09/18/17 0400  BP: 126/75 (!) 114/57  Pulse: 75 71  Resp: 16 16  Temp: 36.8 C 36.6 C  SpO2: 96% 96%    Last Pain:  Vitals:   09/18/17 0405  TempSrc:   PainSc: 2                  Mikeila Burgen,W. EDMOND

## 2017-09-18 NOTE — Evaluation (Signed)
Physical Therapy Evaluation and Discharge Patient Details Name: Jeremiah Flores MRN: [id number] DOB: Apr 22, 1986 Today's Date: 09/18/2017   History of Present Illness  Pt is a 32 y/o male who presents s/p R L5-S1 microdiscectomy on 09/17/17. PMH significant for heart murmur, bipolar disorder, R inner ear surgery.  Clinical Impression  Patient evaluated by Physical Therapy with no further acute PT needs identified. All education has been completed and the patient has no further questions. At the time of PT eval pt was able to perform transfers and ambulation with gross modified independence. Pt was educated on precautions, safe activity progression, and car transfer. See below for any follow-up Physical Therapy or equipment needs. PT is signing off. Thank you for this referral.     Follow Up Recommendations No PT follow up;Supervision for mobility/OOB    Equipment Recommendations  None recommended by PT    Recommendations for Other Services       Precautions / Restrictions Precautions Precautions: Fall;Back Precaution Booklet Issued: Yes (comment) Precaution Comments: Reviewed handout with pt. He was cued for precautions during functional mobility.  Required Braces or Orthoses: ("No brace needed" order) Restrictions Weight Bearing Restrictions: No      Mobility  Bed Mobility Overal bed mobility: Modified Independent Bed Mobility: Rolling;Sidelying to Sit Rolling: Independent Sidelying to sit: Modified independent (Device/Increase time)       General bed mobility comments: Pt demonstrated proper log roll technique with HOB flat and rails lowered to simulate home environment.   Transfers Overall transfer level: Modified independent Equipment used: None             General transfer comment: Pt demonstrated good posture and maintained precautions during power-up to full stand.   Ambulation/Gait Ambulation/Gait assistance: Modified independent (Device/Increase  time) Ambulation Distance (Feet): 400 Feet Assistive device: None Gait Pattern/deviations: Step-through pattern;Decreased stride length;Antalgic Gait velocity: Decreased Gait velocity interpretation: Below normal speed for age/gender General Gait Details: Antalgic due to pain and weakness due to radicular symptoms prior to surgery. Pt slow and guarded but without LOB. Overall mod I.   Stairs            Wheelchair Mobility    Modified Rankin (Stroke Patients Only)       Balance Overall balance assessment: Mild deficits observed, not formally tested                                           Pertinent Vitals/Pain Pain Assessment: Faces Faces Pain Scale: Hurts little more Pain Location: Incision site Pain Descriptors / Indicators: Operative site guarding;Aching Pain Intervention(s): Limited activity within patient's tolerance;Monitored during session;Repositioned    Home Living Family/patient expects to be discharged to:: Private residence Living Arrangements: Spouse/significant other Available Help at Discharge: Family;Available 24 hours/day Type of Home: House Home Access: Level entry     Home Layout: One level Home Equipment: Hand held shower head      Prior Function Level of Independence: Independent         Comments: RLE pain and weakness prior to surgery     Hand Dominance        Extremity/Trunk Assessment   Upper Extremity Assessment Upper Extremity Assessment: Overall WFL for tasks assessed    Lower Extremity Assessment Lower Extremity Assessment: RLE deficits/detail RLE Deficits / Details: Decreased strength consistent with pre-op diagnosis. States no pain or sensation deficits since surgery  Cervical / Trunk Assessment Cervical / Trunk Assessment: Other exceptions Cervical / Trunk Exceptions: s/p surgery  Communication   Communication: No difficulties  Cognition Arousal/Alertness: Awake/alert Behavior During  Therapy: WFL for tasks assessed/performed Overall Cognitive Status: Within Functional Limits for tasks assessed                                        General Comments      Exercises     Assessment/Plan    PT Assessment Patent does not need any further PT services  PT Problem List         PT Treatment Interventions      PT Goals (Current goals can be found in the Care Plan section)  Acute Rehab PT Goals Patient Stated Goal: Home today PT Goal Formulation: All assessment and education complete, DC therapy    Frequency     Barriers to discharge        Co-evaluation               AM-PAC PT "6 Clicks" Daily Activity  Outcome Measure Difficulty turning over in bed (including adjusting bedclothes, sheets and blankets)?: None Difficulty moving from lying on back to sitting on the side of the bed? : None Difficulty sitting down on and standing up from a chair with arms (e.g., wheelchair, bedside commode, etc,.)?: A Little Help needed moving to and from a bed to chair (including a wheelchair)?: A Little Help needed walking in hospital room?: A Little Help needed climbing 3-5 steps with a railing? : A Little 6 Click Score: 20    End of Session   Activity Tolerance: Patient tolerated treatment well Patient left: in chair;with call bell/phone within reach Nurse Communication: Mobility status PT Visit Diagnosis: Pain;Other symptoms and signs involving the nervous system (R29.898) Pain - part of body: (back)    Time: 8676-7209 PT Time Calculation (min) (ACUTE ONLY): 18 min   Charges:   PT Evaluation $PT Eval Moderate Complexity: 1 Mod     PT G Codes:        Rolinda Roan, PT, DPT Acute Rehabilitation Services Pager: [phone]   Thelma Comp 09/18/2017, 8:57 AM

## 2017-09-18 NOTE — Progress Notes (Signed)
Patient alert and oriented, mae's well, voiding adequate amount of urine, swallowing without difficulty, no c/o pain at time of discharge. Patient discharged home with family. Script and discharged instructions given to patient. Patient and family stated understanding of instructions given. Patient has an appointment with Dr. Yates  

## 2017-09-18 NOTE — Evaluation (Signed)
Occupational Therapy Evaluation Patient Details Name: Jeremiah Flores MRN: [id number] DOB: 08/19/1985 Today's Date: 09/18/2017    History of Present Illness Pt is a 32 y/o male who presents s/p R L5-S1 microdiscectomy on 09/17/17. PMH significant for heart murmur, bipolar disorder, R inner ear surgery.   Clinical Impression   Patient evaluated by Occupational Therapy with no further acute OT needs identified. All education has been completed and the patient has no further questions. See below for any follow-up Occupational Therapy or equipment needs. OT to sign off. Thank you for referral.      Follow Up Recommendations  No OT follow up    Equipment Recommendations       Recommendations for Other Services       Precautions / Restrictions Precautions Precautions: Fall;Back Precaution Booklet Issued: Yes (comment) Precaution Comments: reviewed back precautions for adls and pt was able to recall 3 out 3 without cues Required Braces or Orthoses: ("No brace needed" order) Restrictions Weight Bearing Restrictions: No      Mobility Bed Mobility Overal bed mobility: Modified Independent Bed Mobility: Rolling;Sidelying to Sit Rolling: Independent Sidelying to sit: Modified independent (Device/Increase time)       General bed mobility comments: in chair on arrival  Transfers Overall transfer level: Modified independent Equipment used: None             General transfer comment: Pt demonstrated good posture and maintained precautions during power-up to full stand.     Balance Overall balance assessment: Mild deficits observed, not formally tested                                         ADL either performed or assessed with clinical judgement   ADL Overall ADL's : Modified independent                                       General ADL Comments: pt dressed on arrival and reviewed dressing R le first and demonstrated a tub transfer  with trash can ( step over with wall use) to simulate home. pt able to close eyes for 10 seconds without a sway so showering will be okay at this time.   Back handout provided and reviewed adls in detail.  avoid sitting for long periods of time, correct bed positioning for sleeping (reports the 3 dogs do not sleep in the bed), correct sequence for bed mobility, avoiding lifting more than 5 pounds and never wash directly over incision. All education is complete and patient indicates understanding.     Vision Baseline Vision/History: No visual deficits       Perception     Praxis      Pertinent Vitals/Pain Pain Assessment: Faces Faces Pain Scale: Hurts little more Pain Location: Incision site Pain Descriptors / Indicators: Operative site guarding;Aching Pain Intervention(s): Premedicated before session;Repositioned;Monitored during session     Hand Dominance Right   Extremity/Trunk Assessment Upper Extremity Assessment Upper Extremity Assessment: Overall WFL for tasks assessed   Lower Extremity Assessment Lower Extremity Assessment: Defer to PT evaluation RLE Deficits / Details: Decreased strength consistent with pre-op diagnosis. States no pain or sensation deficits since surgery   Cervical / Trunk Assessment Cervical / Trunk Assessment: Other exceptions Cervical / Trunk Exceptions: s/p surgery   Communication Communication Communication: No difficulties  Cognition Arousal/Alertness: Awake/alert Behavior During Therapy: WFL for tasks assessed/performed Overall Cognitive Status: Within Functional Limits for tasks assessed                                     General Comments  educated on use of clean fresh linens for each shower    Exercises     Shoulder Instructions      Home Living Family/patient expects to be discharged to:: Private residence Living Arrangements: Spouse/significant other Available Help at Discharge: Family;Available 24  hours/day Type of Home: House Home Access: Level entry     Home Layout: One level     Bathroom Shower/Tub: Teacher, early years/pre: Standard     Home Equipment: Hand held shower head   Additional Comments: pt has 3 dogs and spouse Quita Skye works from home      Prior Functioning/Environment Level of Independence: Independent        Comments: RLE pain and weakness prior to surgery        OT Problem List:        OT Treatment/Interventions:      OT Goals(Current goals can be found in the care plan section) Acute Rehab OT Goals Patient Stated Goal: Home today  OT Frequency:     Barriers to D/C:            Co-evaluation              AM-PAC PT "6 Clicks" Daily Activity     Outcome Measure Help from another person eating meals?: None Help from another person taking care of personal grooming?: None Help from another person toileting, which includes using toliet, bedpan, or urinal?: None Help from another person bathing (including washing, rinsing, drying)?: None Help from another person to put on and taking off regular upper body clothing?: None Help from another person to put on and taking off regular lower body clothing?: None 6 Click Score: 24   End of Session Nurse Communication: Mobility status;Precautions  Activity Tolerance: Patient tolerated treatment well Patient left:    OT Visit Diagnosis: Unsteadiness on feet (R26.81)                Time: 5170-0174 OT Time Calculation (min): 18 min Charges:  OT General Charges $OT Visit: 1 Visit OT Evaluation $OT Eval Low Complexity: 1 Low G-CodesJeri Modena   OTR/L Pager: [phone] Office: [phone] .   Parke Poisson B 09/18/2017, 9:29 AM

## 2017-09-25 ENCOUNTER — Ambulatory Visit (INDEPENDENT_AMBULATORY_CARE_PROVIDER_SITE_OTHER): Payer: 59 | Admitting: Orthopaedic Surgery

## 2017-09-25 ENCOUNTER — Encounter (INDEPENDENT_AMBULATORY_CARE_PROVIDER_SITE_OTHER): Payer: Self-pay | Admitting: Orthopaedic Surgery

## 2017-09-25 VITALS — BP 125/76 | HR 70

## 2017-09-25 DIAGNOSIS — Z9889 Other specified postprocedural states: Secondary | ICD-10-CM

## 2017-09-25 MED ORDER — OXYCODONE-ACETAMINOPHEN 5-325 MG PO TABS: 1.0000 | ORAL_TABLET | Freq: Four times a day (QID) | ORAL | 0 refills | Status: DC | PRN

## 2017-09-25 NOTE — Discharge Summary (Signed)
Patient ID: Jeremiah Flores MRN: [id number] DOB/AGE: March 18, 1986 32 y.o.  Admit date: 09/17/2017 Discharge date: 09/25/2017  Admission Diagnoses:  Active Problems:   HNP (herniated nucleus pulposus), lumbar   Discharge Diagnoses:  Active Problems:   HNP (herniated nucleus pulposus), lumbar  status post Procedure(s): RIGHT L5-S1 MICRODISCECTOMY  Past Medical History:  Diagnosis Date  . Acid reflux   . Alcoholism ([place])   . Anxiety   . Arthritis   . Bipolar disorder ([place])   . Depression   . Headache   . Heart murmur    AS CHILD  -   RESOLVED  . Sleep apnea    CPAP 2018     Surgeries: Procedure(s): RIGHT L5-S1 MICRODISCECTOMY on 09/17/2017   Consultants:   Discharged Condition: Improved  Hospital Course: Jeremiah Flores is an 32 y.o. male who was admitted 09/17/2017 for operative treatment of lumbar HNP. Patient failed conservative treatments (please see the history and physical for the specifics) and had severe unremitting pain that affects sleep, daily activities and work/hobbies. After pre-op clearance, the patient was taken to the operating room on 09/17/2017 and underwent  Procedure(s): RIGHT L5-S1 MICRODISCECTOMY.    Patient was given perioperative antibiotics:  Anti-infectives (From admission, onward)   Start     Dose/Rate Route Frequency Ordered Stop   09/17/17 2100  ceFAZolin (ANCEF) IVPB 1 g/50 mL premix     1 g 100 mL/hr over 30 Minutes Intravenous Every 8 hours 09/17/17 1626 09/18/17 0506   09/17/17 1130  ceFAZolin (ANCEF) IVPB 2g/100 mL premix     2 g 200 mL/hr over 30 Minutes Intravenous On call to O.R. 09/17/17 1038 09/17/17 1311       Patient was given sequential compression devices and early ambulation to prevent DVT.   Patient benefited maximally from hospital stay and there were no complications. At the time of discharge, the patient was urinating/moving their bowels without difficulty, tolerating a regular diet, pain is controlled with oral  pain medications and they have been cleared by PT/OT.   Recent vital signs: No data found.   Recent laboratory studies: No results for input(s): WBC, HGB, HCT, PLT, NA, K, CL, CO2, BUN, CREATININE, GLUCOSE, INR, CALCIUM in the last 72 hours.  Invalid input(s): PT, 2   Discharge Medications:   Allergies as of 09/18/2017      Reactions   Proventil [albuterol]    Childhood allergy, unknown reaction   Sulfa Antibiotics    Childhood allergy, unknown reaction      Medication List    STOP taking these medications   diclofenac 75 MG EC tablet Commonly known as:  VOLTAREN   diphenhydrAMINE 25 MG tablet Commonly known as:  BENADRYL   ibuprofen 200 MG tablet Commonly known as:  ADVIL,MOTRIN     TAKE these medications   baclofen 10 MG tablet Commonly known as:  LIORESAL Take 10 mg by mouth daily as needed for muscle spasms.   clindamycin 1 % external solution Commonly known as:  CLEOCIN T Apply 1 application topically 2 (two) times a week. Apply to scalp   gabapentin 300 MG capsule Commonly known as:  NEURONTIN Take 900 mg by mouth at bedtime.   ketoconazole 2 % shampoo Commonly known as:  NIZORAL Apply 1 application topically 2 (two) times a week.   naproxen sodium 220 MG tablet Commonly known as:  ALEVE Take 440 mg by mouth daily as needed (pain).   omeprazole 40 MG capsule Commonly known as:  PRILOSEC Take 40 mg by mouth daily.   QUEtiapine 25 MG tablet Commonly known as:  SEROQUEL Take 12.5 mg by mouth at bedtime as needed (sleep).   traZODone 100 MG tablet Commonly known as:  DESYREL Take 100 mg by mouth at bedtime.   VIIBRYD 20 MG Tabs Generic drug:  Vilazodone HCl Take 20 mg by mouth daily.       Diagnostic Studies: Dg Lumbar Spine 1 View  Result Date: 09/17/2017 CLINICAL DATA:  Lumbar microdiskectomy. EXAM: LUMBAR SPINE - 1 VIEW COMPARISON:  Lumbar radiographs 09/07/2017 FINDINGS: A single portable cross-table lateral radiograph of the lumbar spine  is provided. Comparison radiographs demonstrate 5 non rib-bearing lumbar type vertebrae. The lowest fully formed intervertebral disc space is designated L5-S1. A metallic surgical instrument projects over the inferior aspect of the L5 spinous process, directed towards the L5-S1 disc space level. IMPRESSION: Intraoperative localization as above. Electronically Signed   By: Logan Bores M.D.   On: 09/17/2017 14:52      Follow-up Information    Marybelle Killings, MD Follow up.   Specialty:  Orthopedic Surgery Contact information: [place] [address] [phone]           Discharge Plan:  discharge to home  Disposition:     Signed: Benjiman Core  09/25/2017, 3:42 PM

## 2017-09-25 NOTE — Progress Notes (Signed)
   Post-Op Visit Note   Patient: Jeremiah Flores           Date of Birth: 1985/11/29           MRN: [id number] Visit Date: 09/25/2017 PCP: Patient, No Pcp Per   Assessment & Plan:  Chief Complaint:  Chief Complaint  Patient presents with  . Lower Back - Routine Post Op  Patient will week status post right L5-S1 microdiscectomy returns.  He is doing well.  Preop leg pain improved.  He has questions about driving and also wanting to participate in a band concert in [place] in a couple of weeks. Visit Diagnoses:  1. Status post lumbar discectomy     Plan: Patient must avoid bending, twisting, lifting.  I will have him follow-up in a couple of weeks to see Dr. Lorin Mercy for recheck to see if it is okay for him to drive down to [place] and participate in a band concert.  I told him that he would still be very early out from surgery and I do not recommend doing that because of the increased risk of disc rerupture.  Told him that we will see with Dr. Lorin Mercy thinks in a couple of weeks.  Follow-Up Instructions: Return in about 2 weeks (around 10/10/2017) for with Dr Lorin Mercy to see if he is okay to travel and drive.   Orders:  No orders of the defined types were placed in this encounter.  Meds ordered this encounter  Medications  . oxyCODONE-acetaminophen (PERCOCET/ROXICET) 5-325 MG tablet    Sig: Take 1 tablet by mouth every 6 (six) hours as needed for severe pain.    Dispense:  50 tablet    Refill:  0    Imaging: No results found.  PMFS History: Patient Active Problem List   Diagnosis Date Noted  . HNP (herniated nucleus pulposus), lumbar 09/17/2017  . 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   Past Medical History:  Diagnosis Date  . Acid reflux   . Alcoholism ([place])   . Anxiety   . Arthritis   . Bipolar disorder ([place])   . Depression   . Headache   . Heart murmur    AS CHILD  -   RESOLVED  . Sleep apnea    CPAP 2018     History reviewed. No  pertinent family history.  Past Surgical History:  Procedure Laterality Date  . INNER EAR SURGERY Right   . LUMBAR LAMINECTOMY/DECOMPRESSION MICRODISCECTOMY N/A 09/17/2017   Procedure: RIGHT L5-S1 MICRODISCECTOMY;  Surgeon: Marybelle Killings, MD;  Location: [facility];  Service: Orthopedics;  Laterality: N/A;  . STOMACH SURGERY     PYLORIC STENOSIS  . TONSILLECTOMY     Social History   Occupational History  . Not on file  Tobacco Use  . Smoking status: Current Every Day Smoker    Years: 9.00  . Smokeless tobacco: Never Used  Substance and Sexual Activity  . Alcohol use: No    Frequency: Never    Comment: HX ETOH ABUSE  SOBER >1 YEAR  . Drug use: No  . Sexual activity: Not on file   Exam Pleasant white male alert and oriented in no acute distress.  Wound looks good.  No drainage or signs of infection.  Bilateral calves nontender.  Neurovascular intact.  No focal motor deficits.

## 2017-10-09 ENCOUNTER — Ambulatory Visit (INDEPENDENT_AMBULATORY_CARE_PROVIDER_SITE_OTHER): Payer: 59 | Admitting: Orthopaedic Surgery

## 2017-12-18 ENCOUNTER — Ambulatory Visit: Payer: 59 | Admitting: Primary Care

## 2017-12-18 ENCOUNTER — Encounter (INDEPENDENT_AMBULATORY_CARE_PROVIDER_SITE_OTHER): Payer: Self-pay

## 2017-12-18 ENCOUNTER — Encounter: Payer: Self-pay | Admitting: Primary Care

## 2017-12-18 VITALS — BP 134/84 | HR 61 | Temp 98.2°F | Ht 72.0 in | Wt 220.2 lb

## 2017-12-18 DIAGNOSIS — R6882 Decreased libido: Secondary | ICD-10-CM

## 2017-12-18 DIAGNOSIS — M5116 Intervertebral disc disorders with radiculopathy, lumbar region: Secondary | ICD-10-CM | POA: Diagnosis not present

## 2017-12-18 DIAGNOSIS — R61 Generalized hyperhidrosis: Secondary | ICD-10-CM | POA: Diagnosis not present

## 2017-12-18 NOTE — Assessment & Plan Note (Signed)
Endorses that his spouse smells "metals" when he sweats. Unsure of this cause, could be side effects from psych meds? Check labs today.

## 2017-12-18 NOTE — Assessment & Plan Note (Signed)
Chronic.  Will have him discuss this with his psychiatrist as this could be side effects of his medications. Also check testosterone, TSH, A1C, CMP.

## 2017-12-18 NOTE — Assessment & Plan Note (Signed)
Underwent surgical intervention in March 2019. Overall doing better except for recent right lower extremity pain. Recommended he discuss these symptoms with his neurosurgeon. He may require additional PT.  No alarm signs today.

## 2017-12-18 NOTE — Progress Notes (Signed)
Subjective:    Patient ID: Jeremiah Flores, male    DOB: 02/01/1986, 32 y.o.   MRN: [id number]  HPI  Jeremiah Flores is a 32 year old male who presents today to establish care and discuss the problems mentioned below. Will obtain old records. He is following through the [facility] for most of his care.   1) Chronic Back Pain: History of lumbar disc herniation with radiculopathy, herniated nucleus pulposus. Previously following with orthopedics in [place] and managed on Percocet and gabapentin. He underwent surgery to L5-S1 in March 2019. He has been running more frequently. He has noticed right lower extremity pain over the last month. He has not contacted his neurosurgeon.  2) Bipolar Disorder: Previously managed on Viibyrd 20 mg, Seroquel 25 mg, Trazodone 100 mg. Currently managed on Trazodone 100 mg, bupropion 75 mg daily, Clonidine 0.1 mg. Currently following with psychiatry and was told yesterday to hold Clonidine and Trazodone, resume Seroquel. His Wellbutrin will be increased to 100 mg. He has a difficulty time with insomnia and will wake 10 times nightly. He does wear a CPAP at night.   3) Allergic Rhinitis: Currently managed on Optivar drops, Flonase nasal spray, Singulair 10 mg, Xyzal, and allergy injections. He is following with the allergist.   4) Decreased Libido: Chronic for years which has caused some tension with is marriage as his husband has a high sex drive. He denies having difficulty obtaining or maintaining erections. He has discussed this with his therapist who recommended a sex therapist. He has not discussed these symptoms with his psychiatrist.   5) Body Odor: Patient told by his husband that he smells like pennies and metals when he sweats. He sweats often for which he was told could be part of his psychiatric medications. He's never mentioned to this to any other provider in the past.  Review of Systems  Genitourinary:       Decreased libido  Musculoskeletal:   Back and right lower extremity pain  Skin:       Excessive sweating  Allergic/Immunologic: Positive for environmental allergies.  Neurological: Negative for numbness.  Psychiatric/Behavioral:       See HPI       Past Medical History:  Diagnosis Date  . Acid reflux   . Alcoholism ([place])   . Anxiety   . Arthritis   . Bipolar disorder ([place])   . Depression   . Headache   . Heart murmur    AS CHILD  -   RESOLVED  . Personal history of colonic polyps   . Sleep apnea    CPAP 2018      Social History   Socioeconomic History  . Marital status: Married    Spouse name: Not on file  . Number of children: Not on file  . Years of education: Not on file  . Highest education level: Not on file  Occupational History  . Not on file  Social Needs  . Financial resource strain: Not on file  . Food insecurity:    Worry: Not on file    Inability: Not on file  . Transportation needs:    Medical: Not on file    Non-medical: Not on file  Tobacco Use  . Smoking status: Current Every Day Smoker    Years: 9.00  . Smokeless tobacco: Never Used  Substance and Sexual Activity  . Alcohol use: No    Frequency: Never    Comment: HX ETOH ABUSE  SOBER >1 YEAR  .  Drug use: No  . Sexual activity: Not on file  Lifestyle  . Physical activity:    Days per week: Not on file    Minutes per session: Not on file  . Stress: Not on file  Relationships  . Social connections:    Talks on phone: Not on file    Gets together: Not on file    Attends religious service: Not on file    Active member of club or organization: Not on file    Attends meetings of clubs or organizations: Not on file    Relationship status: Not on file  . Intimate partner violence:    Fear of current or ex partner: Not on file    Emotionally abused: Not on file    Physically abused: Not on file    Forced sexual activity: Not on file  Other Topics Concern  . Not on file  Social History Narrative   Married.    No children.    Works as a Ship broker.    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    Enjoys playing music, playing video games, playing music.     Past Surgical History:  Procedure Laterality Date  . INNER EAR SURGERY Right   . LUMBAR LAMINECTOMY/DECOMPRESSION MICRODISCECTOMY N/A 09/17/2017   Procedure: RIGHT L5-S1 MICRODISCECTOMY;  Surgeon: Marybelle Killings, MD;  Location: [facility];  Service: Orthopedics;  Laterality: N/A;  . STOMACH SURGERY     PYLORIC STENOSIS  . TONSILLECTOMY      Family History  Problem Relation Age of Onset  . Arthritis Mother   . Asthma Mother   . Depression Mother   . COPD Mother   . Heart attack Mother   . Heart disease Mother   . Hypertension Mother   . Alcohol abuse Father   . Diabetes Father   . Early death Father   . Lung cancer Father   . Cervical cancer Sister     Allergies  Allergen Reactions  . Proventil [Albuterol]     Childhood allergy, unknown reaction  . Sulfa Antibiotics     Childhood allergy, unknown reaction    Current Outpatient Medications on File Prior to Visit  Medication Sig Dispense Refill  . azelastine (OPTIVAR) 0.05 % ophthalmic solution INSTILL 1 DROP INTO AFFECTED EYE TWICE A DAY  5  . buPROPion (WELLBUTRIN) 75 MG tablet Take 75 mg by mouth daily.  0  . clindamycin (CLEOCIN T) 1 % lotion Apply topically 2 (two) times daily.    . fluticasone (FLONASE) 50 MCG/ACT nasal spray SPRAY 1 SPRAY INTO EACH NOSTRIL EVERY DAY  5  . levocetirizine (XYZAL) 5 MG tablet every evening.  5  . montelukast (SINGULAIR) 10 MG tablet Take 10 mg by mouth daily.  5  . omeprazole (PRILOSEC) 40 MG capsule Take 40 mg by mouth daily.    . traZODone (DESYREL) 50 MG tablet 1 TABLET AT BEDTIME AS NEEDED ONCE A DAY ORALLY 30  0  . Vilazodone HCl 20 MG TABS Take 30 mg by mouth daily.     . cloNIDine (CATAPRES) 0.1 MG tablet TAKE 1 TABLET BY MOUTH EVERY DAY AT NIGHT  0   No current facility-administered medications on file  prior to visit.     BP 134/84   Pulse 61   Temp 98.2 F (36.8 C) (Oral)   Ht 6' (1.829 m)   Wt 220 lb 4 oz (99.9 kg)   SpO2 98%  BMI 29.87 kg/m    Objective:   Physical Exam  Constitutional: He appears well-nourished.  Neck: Neck supple.  Cardiovascular: Normal rate and regular rhythm.  Respiratory: Effort normal and breath sounds normal.  Musculoskeletal:  5/5 strength to bilateral lower extremities. Positive straight leg raise to right lower extremity.   Skin: Skin is warm and dry.  Psychiatric: He has a normal mood and affect.           Assessment & Plan:

## 2017-12-18 NOTE — Patient Instructions (Signed)
Schedule a lab only appointment to return between the hours of 8 am and 10 am at your convenience.  I'll be in touch once I receive the results.  Please contact your surgeon regarding your lower leg pain.  It was a pleasure to meet you today! Please don't hesitate to call or message me with any questions. Welcome to [facility]!

## 2017-12-19 ENCOUNTER — Other Ambulatory Visit (INDEPENDENT_AMBULATORY_CARE_PROVIDER_SITE_OTHER): Payer: 59

## 2017-12-19 DIAGNOSIS — R61 Generalized hyperhidrosis: Secondary | ICD-10-CM | POA: Diagnosis not present

## 2017-12-19 DIAGNOSIS — R6882 Decreased libido: Secondary | ICD-10-CM | POA: Diagnosis not present

## 2017-12-19 LAB — COMPREHENSIVE METABOLIC PANEL
ALT: 47 U/L (ref 0–53)
AST: 23 U/L (ref 0–37)
Albumin: 4.9 g/dL (ref 3.5–5.2)
Alkaline Phosphatase: 57 U/L (ref 39–117)
BILIRUBIN TOTAL: 0.8 mg/dL (ref 0.2–1.2)
BUN: 17 mg/dL (ref 6–23)
CALCIUM: 10 mg/dL (ref 8.4–10.5)
CO2: 30 mEq/L (ref 19–32)
Chloride: 104 mEq/L (ref 96–112)
Creatinine, Ser: 1.24 mg/dL (ref 0.40–1.50)
GFR: 72.05 mL/min (ref >60.00)
GLUCOSE: 110 mg/dL — AB (ref 70–99)
Potassium: 4.5 mEq/L (ref 3.5–5.1)
Sodium: 141 mEq/L (ref 135–145)
Total Protein: 7.7 g/dL (ref 6.0–8.3)

## 2017-12-19 LAB — HEMOGLOBIN A1C: Hgb A1c MFr Bld: 5.5 % (ref 4.6–6.5)

## 2017-12-19 LAB — TSH: TSH: 0.93 u[IU]/mL (ref 0.35–4.50)

## 2017-12-28 LAB — TESTOS,TOTAL,FREE AND SHBG (FEMALE)
FREE TESTOSTERONE: 65.3 pg/mL (ref 35.0–155.0)
Sex Hormone Binding: 28 nmol/L (ref 10–50)
TESTOSTERONE, TOTAL, LC-MS-MS: 473 ng/dL (ref 250–1100)

## 2018-01-24 ENCOUNTER — Encounter: Payer: Self-pay | Admitting: Primary Care

## 2018-01-24 ENCOUNTER — Ambulatory Visit: Payer: 59 | Admitting: Primary Care

## 2018-01-24 VITALS — BP 128/78 | HR 66 | Temp 98.3°F | Wt 212.0 lb

## 2018-01-24 DIAGNOSIS — F319 Bipolar disorder, unspecified: Secondary | ICD-10-CM | POA: Diagnosis not present

## 2018-01-24 DIAGNOSIS — G479 Sleep disorder, unspecified: Secondary | ICD-10-CM | POA: Insufficient documentation

## 2018-01-24 NOTE — Assessment & Plan Note (Signed)
Following with psychiatry, doing well on Vybrid. Also seeing sleep psychology.

## 2018-01-24 NOTE — Progress Notes (Signed)
Subjective:    Patient ID: Jeremiah Flores, male    DOB: Feb 24, 1986, 32 y.o.   MRN: [id number]  HPI  [name] is a 32 year old male who presents today to discuss insomnia.   He has a history of bipolar disorder and is managed on Vilazodone 20 mg. He is currently following with psychiatry and also sleep psychology and is undergoing CBT. He's tried Trazodone, Seroquel, Clonidine without improvement. He and his psychiatrist are avoiding potentially habit forming medications.   He has no difficulty falling asleep, but will wake up 10-15 times nightly fullly conscious. He will then fall back asleep within 2-5 minutes. He wakes up in the morning feeling drowsy, it will take 2-3 hours before he feels awake. He is compliant to his CPAP.   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 He did see a neurologist late last 2018, no testing done, was told perhaps that symptoms could be narcolepsy. He was unable to follow up as he moved to this area soon after evaluation.   Review of Systems  Respiratory: Negative for shortness of breath.   Cardiovascular: Negative for chest pain.  Psychiatric/Behavioral: Positive for sleep disturbance.       Past Medical History:  Diagnosis Date  . Acid reflux   . Alcoholism ([place])   . Anxiety   . Arthritis   . Bipolar disorder ([place])   . Depression   . Headache   . Heart murmur    AS CHILD  -   RESOLVED  . Personal history of colonic polyps   . Sleep apnea    CPAP 2018      Social History   Socioeconomic History  . Marital status: Married    Spouse name: Not on file  . Number of children: Not on file  . Years of education: Not on file  . Highest education level: Not on file  Occupational History  . Not on file  Social Needs  . Financial resource strain: Not on file  . Food insecurity:    Worry: Not on file    Inability: Not on file  . Transportation needs:    Medical: Not on  file    Non-medical: Not on file  Tobacco Use  . Smoking status: Current Every Day Smoker    Years: 9.00  . Smokeless tobacco: Never Used  Substance and Sexual Activity  . Alcohol use: No    Frequency: Never    Comment: HX ETOH ABUSE  SOBER >1 YEAR  . Drug use: No  . Sexual activity: Not on file  Lifestyle  . Physical activity:    Days per week: Not on file    Minutes per session: Not on file  . Stress: Not on file  Relationships  . Social connections:    Talks on phone: Not on file    Gets together: Not on file    Attends religious service: Not on file    Active member of club or organization: Not on file    Attends meetings of clubs or organizations: Not on file    Relationship status: Not on file  . Intimate partner violence:    Fear of current or ex partner: Not on file    Emotionally abused: Not on file    Physically abused: Not on file    Forced sexual activity: Not on file  Other Topics Concern  .  Not on file  Social History Narrative   Married.    No children.   Works as a Ship broker.    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    Enjoys playing music, playing video games, playing music.     Past Surgical History:  Procedure Laterality Date  . INNER EAR SURGERY Right   . LUMBAR LAMINECTOMY/DECOMPRESSION MICRODISCECTOMY N/A 09/17/2017   Procedure: RIGHT L5-S1 MICRODISCECTOMY;  Surgeon: Marybelle Killings, MD;  Location: [facility];  Service: Orthopedics;  Laterality: N/A;  . STOMACH SURGERY     PYLORIC STENOSIS  . TONSILLECTOMY      Family History  Problem Relation Age of Onset  . Arthritis Mother   . Asthma Mother   . Depression Mother   . COPD Mother   . Heart attack Mother   . Heart disease Mother   . Hypertension Mother   . Alcohol abuse Father   . Diabetes Father   . Early death Father   . Lung cancer Father   . Cervical cancer Sister     Allergies  Allergen Reactions  . Proventil [Albuterol]      Childhood allergy, unknown reaction  . Sulfa Antibiotics     Childhood allergy, unknown reaction    Current Outpatient Medications on File Prior to Visit  Medication Sig Dispense Refill  . azelastine (OPTIVAR) 0.05 % ophthalmic solution INSTILL 1 DROP INTO AFFECTED EYE TWICE A DAY  5  . clindamycin (CLEOCIN T) 1 % lotion Apply topically 2 (two) times daily.    . fluticasone (FLONASE) 50 MCG/ACT nasal spray SPRAY 1 SPRAY INTO EACH NOSTRIL EVERY DAY  5  . levocetirizine (XYZAL) 5 MG tablet every evening.  5  . montelukast (SINGULAIR) 10 MG tablet Take 10 mg by mouth daily.  5  . omeprazole (PRILOSEC) 40 MG capsule Take 40 mg by mouth daily.    . Vilazodone HCl 20 MG TABS Take 30 mg by mouth daily.      No current facility-administered medications on file prior to visit.     BP 128/78   Pulse 66   Temp 98.3 F (36.8 C) (Oral)   Wt 212 lb (96.2 kg)   SpO2 97%   BMI 28.75 kg/m    Objective:   Physical Exam  Constitutional: He appears well-nourished.  Neck: Neck supple.  Cardiovascular: Normal rate and regular rhythm.  Respiratory: Effort normal and breath sounds normal.  Skin: Skin is warm and dry.  Psychiatric: He has a normal mood and affect.           Assessment & Plan:

## 2018-01-24 NOTE — Patient Instructions (Signed)
You will be contacted regarding your referral to Neurology.  Please let us know if you have not been contacted within one week.   Continue using your CPAP machine.  It was a pleasure to see you today!

## 2018-01-24 NOTE — Assessment & Plan Note (Signed)
Chronic, waking 10-15 times nightly. Compliant to CPAP machine.  Failed numerous Rx medications. Agree to avoid habit forming medications. Continue following with sleep psychology and psychiatry. Referral placed to neurology for further evaluation/testing.

## 2018-02-27 ENCOUNTER — Ambulatory Visit: Payer: 59 | Admitting: Neurology

## 2018-02-27 ENCOUNTER — Encounter: Payer: Self-pay | Admitting: Neurology

## 2018-02-27 VITALS — BP 142/78 | HR 60 | Ht 72.0 in | Wt 214.0 lb

## 2018-02-27 DIAGNOSIS — G4710 Hypersomnia, unspecified: Secondary | ICD-10-CM

## 2018-02-27 DIAGNOSIS — G4730 Sleep apnea, unspecified: Secondary | ICD-10-CM

## 2018-02-27 DIAGNOSIS — G4733 Obstructive sleep apnea (adult) (pediatric): Secondary | ICD-10-CM

## 2018-02-27 DIAGNOSIS — Z9989 Dependence on other enabling machines and devices: Secondary | ICD-10-CM | POA: Diagnosis not present

## 2018-02-27 NOTE — Progress Notes (Signed)
Subjective:    Patient ID: Jeremiah Flores is a 32 y.o. male.  HPI     Star Age, MD, PhD [facility] [address]O. [place], [place] [address]  Dear Belenda Cruise,   I saw your patient, Jeremiah Flores, upon your kind request, in my neurologic clinic today for initial consultation of his sleep disturbance, including his prior diagnosis of obstructive sleep apnea. The patient is unaccompanied today. As you know, Jeremiah Flores is a 32 year old right-handed gentleman with an underlying medical history of anxiety, depression, reflux disease, arthritis, and overweight state, who was diagnosed with severe obstructive sleep apnea through the [place] in 2018. I reviewed Jeremiah Flores which indicate an AHI of 41.1 per hour during the PSG from 11/19/2016, O2 nadir of 83%, no significant PLMS. He was titrated on CPAP at the time with a pressure of 6 cm resulting in an AHI of 0.7 per hour. He received his CPAP in or around July 2018. I reviewed your office note from 01/24/2018. He has been followed by psychiatry for his mood disorder. He is currently on Viibryd. He has chronic difficulty with sleep initiation and sleep maintenance in particular. He has multiple nighttime awakenings. His Epworth sleepiness score is 15 out of 24 today, fatigue score is 45 out of 63.He has tried multiple prescription medicine for sleep including trazodone, clonidine, Seroquel and OTC melatonin. Lives with husband, 3 dogs in the household. TV in the bedroom, not on at night.  He has a suspected sleep apnea in the FHx, suspects that mom has it.  Reports vivid dream, sometimes wakes up crying, lost sister and nephew during a car accident, father died at age 55. Mom is 50 yo. He has been seeing a sleep psychologist through the [place] and has been working through issues of difficulty staying asleep. He is also seeing psychiatry. He  was advised that the psychiatrist did not want to put him on any  potentially habit-forming sleep aids. He has a history of alcoholism and has been sober for 19 months. He's working on smoking cessation and reduced his cigarettes to 4-6 per day on average. He does not use any illicit drugs. He had back surgery earlier this year and took prescription pain medication for the first 2 weeks after surgery. He had tonsillectomy last year. His biggest complaint is difficulty staying asleep. He denies sleep paralysis or hypnagogic or hypnopompic hallucinations. He has felt sleepy while driving at times, has never fallen asleep instantly as an sleep attacks. He has no family history of narcolepsy. He is working on his CPAP usage. He has been able to increase his usage to up to 8 hours some nights, his increase in usage is reflected in his download and we reviewed his compliance download together. In the last 30 days he has used his CPAP every night with percent used days greater than 4 hours at 36.7%, which is still suboptimal, average usage of 3 hours and 48 minutes, he is on AutoPap with 90th percentile pressure at 7.8 cm, leaked very low. Average AHI less than 5.0, current score for the past 30 days was 3.9 per hour. He uses a large F10 full face mask. He drinks caffeine in the form of coffee, 3-4 cups per day on average, typically before lunch.  His Past Medical History Is Significant For: Past Medical History:  Diagnosis Date  . Acid reflux   . Alcoholism ([place])   . Anxiety   . Arthritis   .  Bipolar disorder ([place])   . Depression   . Headache   . Heart murmur    AS CHILD  -   RESOLVED  . Personal history of colonic polyps   . Sleep apnea    CPAP 2018     His Past Surgical History Is Significant For: Past Surgical History:  Procedure Laterality Date  . INNER EAR SURGERY Right   . LUMBAR LAMINECTOMY/DECOMPRESSION MICRODISCECTOMY N/A 09/17/2017   Procedure: RIGHT L5-S1 MICRODISCECTOMY;  Surgeon: Marybelle Killings, MD;  Location: [place];  Service: Orthopedics;  Laterality:  N/A;  . STOMACH SURGERY     PYLORIC STENOSIS  . TONSILLECTOMY      His Family History Is Significant For: Family History  Problem Relation Age of Onset  . Arthritis Mother   . Asthma Mother   . Depression Mother   . COPD Mother   . Heart attack Mother   . Heart disease Mother   . Hypertension Mother   . Alcohol abuse Father   . Diabetes Father   . Early death Father   . Lung cancer Father   . Cervical cancer Sister     His Social History Is Significant For: Social History   Socioeconomic History  . Marital status: Married    Spouse name: Not on file  . Number of children: Not on file  . Years of education: Not on file  . Highest education level: Not on file  Occupational History  . Not on file  Social Needs  . Financial resource strain: Not on file  . Food insecurity:    Worry: Not on file    Inability: Not on file  . Transportation needs:    Medical: Not on file    Non-medical: Not on file  Tobacco Use  . Smoking status: Current Every Day Smoker    Years: 9.00  . Smokeless tobacco: Never Used  Substance and Sexual Activity  . Alcohol use: No    Frequency: Never    Comment: HX ETOH ABUSE  SOBER >1 YEAR  . Drug use: No  . Sexual activity: Not on file  Lifestyle  . Physical activity:    Days per week: Not on file    Minutes per session: Not on file  . Stress: Not on file  Relationships  . Social connections:    Talks on phone: Not on file    Gets together: Not on file    Attends religious service: Not on file    Active member of club or organization: Not on file    Attends meetings of clubs or organizations: Not on file    Relationship status: Not on file  Other Topics Concern  . Not on file  Social History Narrative   Married.    No children.   Works as a Ship broker.    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    Enjoys playing music, playing video games, playing music.     His Allergies Are:   Allergies  Allergen Reactions  . Proventil [Albuterol]     Childhood allergy, unknown reaction  . Sulfa Antibiotics     Childhood allergy, unknown reaction  :   His Current Medications Are:  Outpatient Encounter Medications as of 02/27/2018  Medication Sig  . azelastine (OPTIVAR) 0.05 % ophthalmic solution INSTILL 1 DROP INTO AFFECTED EYE TWICE A DAY  . clindamycin (CLEOCIN T) 1 % lotion Apply topically 2 (two) times daily.  . fluticasone (  FLONASE) 50 MCG/ACT nasal spray SPRAY 1 SPRAY INTO EACH NOSTRIL EVERY DAY  . levocetirizine (XYZAL) 5 MG tablet every evening.  . montelukast (SINGULAIR) 10 MG tablet Take 10 mg by mouth daily.  Marland Kitchen omeprazole (PRILOSEC) 40 MG capsule Take 40 mg by mouth daily.  . Vilazodone HCl 20 MG TABS Take 30 mg by mouth daily.    No facility-administered encounter medications on file as of 02/27/2018.   :  Review of Systems:  Out of a complete 14 point review of systems, all are reviewed and negative with the exception of these symptoms as listed below:  Review of Systems  Neurological:       Pt presents today to discuss why he wakes up numerous times during the night. He can wake up 12-20 times per night. Pt attempts to use his cpap. Pt has been told that he may have "non-24" or narcolepsy. Pt's cpap care is through the [place]. Pt has been tried on seroquel, trazodone, clonidine, and melatonin.  Epworth Sleepiness Scale 0= would never doze 1= slight chance of dozing 2= moderate chance of dozing 3= high chance of dozing  Sitting and reading: 2 Watching TV: 1 Sitting inactive in a public place (ex. Theater or meeting): 2 As a passenger in a car for an hour without a break: 3 Lying down to rest in the afternoon: 3 Sitting and talking to someone: 1 Sitting quietly after lunch (no alcohol): 2 In a car, while stopped in traffic: 1 Total: 15     Objective:  Neurological Exam  Physical Exam Physical Examination:   Vitals:   02/27/18 1053  BP: (!)  142/78  Pulse: 60    General Examination: The patient is a very pleasant 32 y.o. male in no acute distress. He appears well-developed and well-nourished and well groomed.   HEENT: Normocephalic, atraumatic, pupils are equal, round and reactive to light and accommodation. Extraocular tracking is good without limitation to gaze excursion or nystagmus noted. Normal smooth pursuit is noted. Hearing is grossly intact. Face is symmetric with normal facial animation and normal facial sensation. Speech is clear with no dysarthria noted. There is no hypophonia. There is no lip, neck/head, jaw or voice tremor. Neck is supple with full range of passive and active motion. There are no carotid bruits on auscultation. Oropharynx exam reveals: mild mouth dryness, adequate dental hygiene and mild airway crowding, due to mildly larger uvula. Mallampati is class I. Tongue protrudes centrally and palate elevates symmetrically. Tonsils are absent. Neck size is 17 1/8 inches.  Chest: Clear to auscultation without wheezing, rhonchi or crackles noted.  Heart: S1+S2+0, regular and normal without murmurs, rubs or gallops noted.   Abdomen: Soft, non-tender and non-distended with normal bowel sounds appreciated on auscultation.  Extremities: There is no pitting edema in the distal lower extremities bilaterally.  Skin: Warm and dry without trophic changes noted.  Musculoskeletal: exam reveals no obvious joint deformities, tenderness or joint swelling or erythema.   Neurologically:  Mental status: The patient is awake, alert and oriented in all 4 spheres. His immediate and remote memory, attention, language skills and fund of knowledge are appropriate. There is no evidence of aphasia, agnosia, apraxia or anomia. Speech is clear with normal prosody and enunciation. Thought process is linear. Mood is normal and affect is normal.  Cranial nerves II - XII are as described above under HEENT exam. In addition: shoulder shrug is  normal with equal shoulder height noted. Motor exam: Normal bulk, strength and tone  is noted. There is no tremor. Fine motor skills and coordination: grossly intact.  Cerebellar testing: No dysmetria or intention tremor. There is no truncal or gait ataxia.  Sensory exam: intact to light touch in the upper and lower extremities.  Gait, station and balance: He stands easily. No veering to one side is noted. No leaning to one side is noted. Posture is age-appropriate and stance is narrow based. Gait shows normal stride length and normal pace. No problems turning are noted.   Assessment and Plan:  In summary, AMDREW OBOYLE is a very pleasant 32 y.o.-year old male with an underlying medical history of anxiety, depression, reflux disease, arthritis, and overweight state, who presents for evaluation of his sleep disorder including daytime somnolence, sleep disruption at night and history of severe obstructive sleep apnea. His history is not telltale for narcolepsy. He has been on CPAP therapy since last year but is not quite yet fully compliant. He is working on his compliance, he has seen psychiatry for his mood disorder and also sleep psychology through the [place]. He is encouraged to continue follow-up with his psychologist and his psychiatrist. We talked about the importance of treating severe sleep apnea. He is advised to strive for 7-8 hours of usage with his CPAP, he is almost halfway fair in terms of his compliance for more than 4 hours each night. His daytime sleepiness may be a function of several issues including sleep disruption at night, not yet fully treated sleep apnea and perhaps medication side effect from taking an antidepressant. Nevertheless, he is working on his sleep issues. He is encouraged to be fully compliant with CPAP and I will reevaluate him in about 3 months. He does see a sleep doctor through the [place], usually once a year he reports. He received his CPAP machine also through the [place]. He  is on AutoPap and AHI has been below 5.0/hour. About the importance of sleep hygiene. He is working on lifestyle changes as well. He is advised to follow-up in about 3 months, sooner if needed. I did not recommend any new medication for him at this time. I answered all his questions today and the patient was in agreement.   Thank you very much for allowing me to participate in the care of this nice patient. If I can be of any further assistance to you please do not hesitate to call me at [phone].  Sincerely,   Star Age, MD, PhD

## 2018-02-27 NOTE — Patient Instructions (Signed)
Your sleepiness may be due to Underlying severe obstructive sleep apnea. You have been working on increasing her CPAP usage. Please work towards using your CPAP every night, all night. Please continue using your CPAP regularly. While your insurance may require that you use CPAP at least 4 hours each night on 70% of the nights, I recommend, that you not skip any nights and use it throughout the night if you can. Getting used to CPAP and staying with the treatment long term does take time and patience and discipline. Untreated obstructive sleep apnea when it is moderate to severe can have an adverse impact on cardiovascular health and raise her risk for heart disease, arrhythmias, hypertension, congestive heart failure, stroke and diabetes. Untreated obstructive sleep apnea causes sleep disruption, nonrestorative sleep, and sleep deprivation. This can have an impact on your day to day functioning and cause daytime sleepiness and impairment of cognitive function, memory loss, mood disturbance, and problems focussing. Using CPAP regularly can improve these symptoms.  I will see you back in 3 months for a compliance recheck. Your history is not suggestive of narcolepsy.

## 2018-05-29 ENCOUNTER — Telehealth: Payer: Self-pay

## 2018-05-29 NOTE — Telephone Encounter (Signed)
I called pt to remind him to bring his cpap to his appt on 06/03/2018 with Dr. Rexene Alberts. No answer, left a message asking him to call me back. If pt calls back, please remind him to bring his cpap.

## 2018-06-03 ENCOUNTER — Ambulatory Visit: Payer: 59 | Admitting: Neurology

## 2018-06-03 ENCOUNTER — Encounter: Payer: Self-pay | Admitting: Neurology

## 2018-06-03 VITALS — BP 151/80 | HR 71 | Ht 72.0 in | Wt 226.0 lb

## 2018-06-03 DIAGNOSIS — G479 Sleep disorder, unspecified: Secondary | ICD-10-CM | POA: Diagnosis not present

## 2018-06-03 DIAGNOSIS — G4719 Other hypersomnia: Secondary | ICD-10-CM | POA: Diagnosis not present

## 2018-06-03 DIAGNOSIS — G4733 Obstructive sleep apnea (adult) (pediatric): Secondary | ICD-10-CM | POA: Diagnosis not present

## 2018-06-03 DIAGNOSIS — Z9989 Dependence on other enabling machines and devices: Secondary | ICD-10-CM | POA: Diagnosis not present

## 2018-06-03 DIAGNOSIS — G4700 Insomnia, unspecified: Secondary | ICD-10-CM

## 2018-06-03 NOTE — Patient Instructions (Addendum)
Please continue using your autoPAP regularly. While your insurance requires that you use PAP at least 4 hours each night on 70% of the nights, I recommend, that you not skip any nights and use it throughout the night if you can. Getting used to PAP and staying with the treatment long term does take time and patience and discipline. Untreated obstructive sleep apnea when it is moderate to severe can have an adverse impact on cardiovascular health and raise her risk for heart disease, arrhythmias, hypertension, congestive heart failure, stroke and diabetes. Untreated obstructive sleep apnea causes sleep disruption, nonrestorative sleep, and sleep deprivation. This can have an impact on your day to day functioning and cause daytime sleepiness and impairment of cognitive function, memory loss, mood disturbance, and problems focussing. Using PAP regularly can improve these symptoms.  Keep up the good work! I will see you back as needed. As discussed, we can consider sleep study testing for excessive sleepiness in the form of a nighttime sleep study followed by a daytime nap study. However, in order to have a valid test result, you would have to taper off the antidepressant and stay off of it for at least 3 weeks. Please make sure you talk to your psychiatrist about this. If you decide to proceed with sleep testing, we can schedule your sleep studies accordingly. Please call our office for this and I can put the orders it. For now, we can play it by ear.   Please remember to try to maintain good sleep hygiene, which means: Keep a regular sleep and wake schedule, try not to exercise or have a meal within 2 hours of your bedtime, try to keep your bedroom conducive for sleep, that is, cool and dark, without light distractors such as an illuminated alarm clock, and refrain from watching TV right before sleep or in the middle of the night and do not keep the TV or radio on during the night. Also, try not to use or play on  electronic devices at bedtime, such as your cell phone, tablet PC or laptop. If you like to read at bedtime on an electronic device, try to dim the background light as much as possible. Do not eat in the middle of the night.

## 2018-06-03 NOTE — Progress Notes (Signed)
Subjective:    Patient ID: Jeremiah Flores is a 32 y.o. male.  HPI     Interim history:   Jeremiah Flores is a 32 year old right-handed gentleman with an underlying medical history of anxiety, depression, reflux disease, arthritis, and overweight state, who presents for follow-up consultation of his obstructive sleep apnea and daytime somnolence. The patient is unaccompanied today. I first met him on 02/27/2018 at the request of the [place] and his primary care NP, which time he was on AutoPap therapy for his severe obstructive sleep apnea. He was not quite fully compliant with AutoPap therapy, reported significant daytime somnolence with an Epworth sleepiness score of 15 out of 24 at the time. He was advised to be fully compliant with AutoPap therapy, reported chronic difficulty with sleep initiation and sleep maintenance, had tried multiple prescription medicines for sleep through the [place], was followed by psychiatry.   Today, 06/03/2018: I reviewed his AutoPap compliance data from 05/04/2018 through 06/02/2018 which is a total of 30 days, during which time he used his machine every night with percent used days greater than 4 hours at 100%, indicating superb compliance with an average usage of 7 hours and 7 minutes, residual AHI at goal at 3.7 per hour, 90th percentile pressure at 7.8 cm with a range of 6-13 cm. He reports full compliance with his AutoPap. He does not necessarily notice any difference in his sleep quality or sleep consolidation. He has no trouble falling asleep but wakes up easily and is a light sleeper. He actually can go back to sleep fairly quickly but his main concern is multiple nighttime awakenings. He denies any sleep paralysis or cataplexy. He can get drowsy while driving but reports that he can stay awake if needed. His Epworth sleepiness score is 12 out of 24 today, which is a little better than last time. He has seen a counselor in the past and has done CBT through the [place]. Weight  has increased since his last visit, he stopped working out for a little while but is motivated to work on weight loss. He also quit smoking.  The patient's allergies, current medications, family history, past medical history, past social history, past surgical history and problem list were reviewed and updated as appropriate.   Previously:   02/27/2018: (He) was diagnosed with severe obstructive sleep apnea through the [place] in 2018. I reviewed [facility] records which indicate an AHI of 41.1 per hour during the PSG from 11/19/2016, O2 nadir of 83%, no significant PLMS. He was titrated on CPAP at the time with a pressure of 6 cm resulting in an AHI of 0.7 per hour. He received his CPAP in or around July 2018. I reviewed your office note from 01/24/2018. He has been followed by psychiatry for his mood disorder. He is currently on Viibryd. He has chronic difficulty with sleep initiation and sleep maintenance in particular. He has multiple nighttime awakenings. His Epworth sleepiness score is 15 out of 24 today, fatigue score is 45 out of 63.He has tried multiple prescription medicine for sleep including trazodone, clonidine, Seroquel and OTC melatonin. Lives with husband, 3 dogs in the household. TV in the bedroom, not on at night.  He has a suspected sleep apnea in the FHx, suspects that mom has it.  Reports vivid dream, sometimes wakes up crying, lost sister and nephew during a car accident, father died at age 79. Mom is 28 yo. He has been seeing a sleep psychologist through the [place] and has been  working through issues of difficulty staying asleep. He is also seeing psychiatry. He  was advised that the psychiatrist did not want to put him on any potentially habit-forming sleep aids. He has a history of alcoholism and has been sober for 19 months. He's working on smoking cessation and reduced his cigarettes to 4-6 per day on average. He does not use any illicit drugs. He had back surgery earlier this year and took  prescription pain medication for the first 2 weeks after surgery. He had tonsillectomy last year. His biggest complaint is difficulty staying asleep. He denies sleep paralysis or hypnagogic or hypnopompic hallucinations. He has felt sleepy while driving at times, has never fallen asleep instantly as an sleep attacks. He has no family history of narcolepsy. He is working on his CPAP usage. He has been able to increase his usage to up to 8 hours some nights, his increase in usage is reflected in his download and we reviewed his compliance download together. In the last 30 days he has used his CPAP every night with percent used days greater than 4 hours at 36.7%, which is still suboptimal, average usage of 3 hours and 48 minutes, he is on AutoPap with 90th percentile pressure at 7.8 cm, leaked very low. Average AHI less than 5.0, current score for the past 30 days was 3.9 per hour. He uses a large F10 full face mask. He drinks caffeine in the form of coffee, 3-4 cups per day on average, typically before lunch.  His Past Medical History Is Significant For: Past Medical History:  Diagnosis Date  . Acid reflux   . Alcoholism ([place])   . Anxiety   . Arthritis   . Bipolar disorder ([place])   . Depression   . Headache   . Heart murmur    AS CHILD  -   RESOLVED  . Personal history of colonic polyps   . Sleep apnea    CPAP 2018     His Past Surgical History Is Significant For: Past Surgical History:  Procedure Laterality Date  . INNER EAR SURGERY Right   . LUMBAR LAMINECTOMY/DECOMPRESSION MICRODISCECTOMY N/A 09/17/2017   Procedure: RIGHT L5-S1 MICRODISCECTOMY;  Surgeon: Marybelle Killings, MD;  Location: [facility];  Service: Orthopedics;  Laterality: N/A;  . STOMACH SURGERY     PYLORIC STENOSIS  . TONSILLECTOMY      His Family History Is Significant For: Family History  Problem Relation Age of Onset  . Arthritis Mother   . Asthma Mother   . Depression Mother   . COPD Mother   . Heart attack Mother   .  Heart disease Mother   . Hypertension Mother   . Alcohol abuse Father   . Diabetes Father   . Early death Father   . Lung cancer Father   . Cervical cancer Sister     His Social History Is Significant For: Social History   Socioeconomic History  . Marital status: Married    Spouse name: Not on file  . Number of children: Not on file  . Years of education: Not on file  . Highest education level: Not on file  Occupational History  . Not on file  Social Needs  . Financial resource strain: Not on file  . Food insecurity:    Worry: Not on file    Inability: Not on file  . Transportation needs:    Medical: Not on file    Non-medical: Not on file  Tobacco Use  .  Smoking status: Current Every Day Smoker    Years: 9.00  . Smokeless tobacco: Never Used  Substance and Sexual Activity  . Alcohol use: No    Frequency: Never    Comment: HX ETOH ABUSE  SOBER >1 YEAR  . Drug use: No  . Sexual activity: Not on file  Lifestyle  . Physical activity:    Days per week: Not on file    Minutes per session: Not on file  . Stress: Not on file  Relationships  . Social connections:    Talks on phone: Not on file    Gets together: Not on file    Attends religious service: Not on file    Active member of club or organization: Not on file    Attends meetings of clubs or organizations: Not on file    Relationship status: Not on file  Other Topics Concern  . Not on file  Social History Narrative   Married.    No children.   Works as a Ship broker.    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    Enjoys playing music, playing video games, playing music.     His Allergies Are:  Allergies  Allergen Reactions  . Proventil [Albuterol]     Childhood allergy, unknown reaction  . Sulfa Antibiotics     Childhood allergy, unknown reaction  :   His Current Medications Are:  Outpatient Encounter Medications as of 06/03/2018  Medication Sig  .  fluticasone (FLONASE) 50 MCG/ACT nasal spray SPRAY 1 SPRAY INTO EACH NOSTRIL EVERY DAY  . levocetirizine (XYZAL) 5 MG tablet every evening.  . montelukast (SINGULAIR) 10 MG tablet Take 10 mg by mouth daily.  . Vilazodone HCl 20 MG TABS Take 30 mg by mouth daily.   Marland Kitchen azelastine (OPTIVAR) 0.05 % ophthalmic solution INSTILL 1 DROP INTO AFFECTED EYE TWICE A DAY  . omeprazole (PRILOSEC) 40 MG capsule Take 40 mg by mouth daily.  . [DISCONTINUED] clindamycin (CLEOCIN T) 1 % lotion Apply topically 2 (two) times daily.   No facility-administered encounter medications on file as of 06/03/2018.   :  Review of Systems:  Out of a complete 14 point review of systems, all are reviewed and negative with the exception of these symptoms as listed below: Review of Systems  Neurological:       Pt presents today to discuss his sleep. Pt reports that he is 100% compliant on auto pap and has stopped smoking but his sleep is not any better.    Objective:  Neurological Exam  Physical Exam Physical Examination:   Vitals:   06/03/18 1013  BP: (!) 151/80  Pulse: 71    General Examination: The patient is a very pleasant 32 y.o. male in no acute distress. He appears well-developed and well-nourished and well groomed.   HEENT: Normocephalic, atraumatic, pupils are equal, round and reactive to light and accommodation. Corrective eyeglasses in place. Extraocular tracking is good without limitation to gaze excursion or nystagmus noted. Normal smooth pursuit is noted. Hearing is grossly intact. Face is symmetric with normal facial animation and normal facial sensation. Speech is clear with no dysarthria noted. There is no hypophonia. There is no lip, neck/head, jaw or voice tremor. Neck shows FROM. There are no carotid bruits on auscultation. Oropharynx exam reveals: mild mouth dryness, adequate dental hygiene and mild airway crowding, due to mildly larger uvula. Mallampati is class I. Tongue protrudes centrally and  palate elevates symmetrically. Tonsils  are absent.   Chest: Clear to auscultation without wheezing, rhonchi or crackles noted.  Heart: S1+S2+0, regular and normal without murmurs, rubs or gallops noted.   Abdomen: Soft, non-tender and non-distended with normal bowel sounds appreciated on auscultation.  Extremities: There is no pitting edema in the distal lower extremities bilaterally.  Skin: Warm and dry without trophic changes noted.  Musculoskeletal: exam reveals no obvious joint deformities, tenderness or joint swelling or erythema.   Neurologically:  Mental status: The patient is awake, alert and oriented in all 4 spheres. His immediate and remote memory, attention, language skills and fund of knowledge are appropriate. There is no evidence of aphasia, agnosia, apraxia or anomia. Speech is clear with normal prosody and enunciation. Thought process is linear. Mood is normal and affect is normal.  Cranial nerves II - XII are as described above under HEENT exam.  Motor exam: Normal bulk, strength and tone is noted. There is no tremor. Fine motor skills and coordination: grossly intact.  Cerebellar testing: No dysmetria or intention tremor. There is no truncal or gait ataxia.  Sensory exam: intact to light touch in the upper and lower extremities.  Gait, station and balance: He stands easily. No veering to one side is noted. No leaning to one side is noted. Posture is age-appropriate and stance is narrow based. Gait shows normal stride length and normal pace. No problems turning are noted.   Assessment and Plan:  In summary, JEREMI LOSITO is a very pleasant 32 y.o.-year old male with an underlying medical history of anxiety, depression, reflux disease, arthritis, and overweight state, who presents for follow-up consultation of his sleep disorder including obstructive sleep apnea for which he is on AutoPap therapy and sleep disruption as well as daytime sleepiness. While his  history is not telltale for narcolepsy, I suggested we could proceed with sleep study testing in the form of a nocturnal polysomnogram, during which he would be on his AutoPap therapy at the current settings, followed by a next day nap study. In order to proceed with sleep study testing he would have to taper off of his antidepressant medication. He reports that he has been quite stable on this medicine and would be reluctant to consider coming off of it at this time. I suggested he discuss with his psychiatrist whether or not it would be safe to taper him off his antidepressant and taper according to psychiatry instructions. Furthermore he would have to be off of his antidepressant for about 3 weeks prior to sleep study testing. He is commended on his AutoPap adherence. He is advised to be fully compliant with AutoPap therapy and follow-up with the [facility] for now. We mutually agreed to have him call when he is ready to proceed with sleep study testing and for now will see him back as needed. I answered all his questions today and the patient was in agreement.  I spent 20 minutes in total face-to-face time with the patient, more than 50% of which was spent in counseling and coordination of care, reviewing test results, reviewing medication and discussing or reviewing the diagnosis of OSA, sleepiness, the prognosis and treatment options. Pertinent laboratory and imaging test results that were available during this visit with the patient were reviewed by me and considered in my medical decision making (see chart for details).

## 2019-04-10 ENCOUNTER — Other Ambulatory Visit (HOSPITAL_BASED_OUTPATIENT_CLINIC_OR_DEPARTMENT_OTHER): Payer: Self-pay

## 2019-04-10 DIAGNOSIS — G4733 Obstructive sleep apnea (adult) (pediatric): Secondary | ICD-10-CM

## 2019-05-01 ENCOUNTER — Other Ambulatory Visit (HOSPITAL_COMMUNITY): Payer: Self-pay

## 2019-05-01 ENCOUNTER — Other Ambulatory Visit (HOSPITAL_COMMUNITY): Payer: 59

## 2019-05-02 ENCOUNTER — Other Ambulatory Visit (HOSPITAL_COMMUNITY)
Admission: RE | Admit: 2019-05-02 | Discharge: 2019-05-02 | Disposition: A | Discharge status: Home / Self Care | Payer: No Typology Code available for payment source | Source: Ambulatory Visit | Attending: Internal Medicine | Admitting: Internal Medicine

## 2019-05-02 DIAGNOSIS — Z20828 Contact with and (suspected) exposure to other viral communicable diseases: Secondary | ICD-10-CM | POA: Insufficient documentation

## 2019-05-02 DIAGNOSIS — Z01812 Encounter for preprocedural laboratory examination: Secondary | ICD-10-CM | POA: Diagnosis present

## 2019-05-02 LAB — SARS CORONAVIRUS 2 (TAT 6-24 HRS): SARS Coronavirus 2: NEGATIVE

## 2019-05-04 ENCOUNTER — Other Ambulatory Visit: Payer: Self-pay

## 2019-05-04 ENCOUNTER — Ambulatory Visit (HOSPITAL_BASED_OUTPATIENT_CLINIC_OR_DEPARTMENT_OTHER): Payer: No Typology Code available for payment source | Attending: Internal Medicine | Admitting: Internal Medicine

## 2019-05-04 VITALS — Ht 72.0 in | Wt 204.0 lb

## 2019-05-04 DIAGNOSIS — G4733 Obstructive sleep apnea (adult) (pediatric): Secondary | ICD-10-CM

## 2019-05-05 ENCOUNTER — Ambulatory Visit (HOSPITAL_BASED_OUTPATIENT_CLINIC_OR_DEPARTMENT_OTHER): Payer: No Typology Code available for payment source | Attending: Internal Medicine | Admitting: Internal Medicine

## 2019-05-05 DIAGNOSIS — G4733 Obstructive sleep apnea (adult) (pediatric): Secondary | ICD-10-CM | POA: Diagnosis present

## 2019-05-10 DIAGNOSIS — G4733 Obstructive sleep apnea (adult) (pediatric): Secondary | ICD-10-CM

## 2019-05-10 NOTE — Procedures (Signed)
     Patient Name: Jeremiah Flores, Jeremiah Flores Date: 05/05/2019 Gender: Male D.O.B: [birth year] Age (years): 33 Referring Provider: Randa Lynn Height (inches): 64 Interpreting Physician: Baird Lyons MD, ABSM Weight (lbs): 204 RPSGT: Jacolyn Reedy BMI: 28 MRN: [id number] Neck Size: 17.00  CLINICAL INFORMATION Sleep Study Type: MSLT The patient was referred to the sleep center for evaluation of daytime sleepiness. Epworth Sleepiness Score: 9  SLEEP STUDY TECHNIQUE A Multiple Sleep Latency Test was performed after an overnight polysomnogram according to the AASM scoring manual v2.3 (April 2016) and clinical guidelines. Five nap opportunities occurred over the course of the test which followed an overnight polysomnogram. The channels recorded and monitored were frontal, central, and occipital electroencephalography (EEG), right and left electrooculogram (EOG), chin electromyography (EMG), and electrocardiogram (EKG).  MEDICATIONS Medications taken by the patient : none reported Medications administered by patient during sleep study : No sleep medicine administered. This study was performed while wearing CPAP 9 per titration from the previous night.  IMPRESSIONS - Total number of naps attempted: 5 . Total number of naps with sleep attained: 5 . The Mean Sleep Latency was 01:21 minutes.  - The patient appears to have pathologic sleepiness, evidenced by a short mean sleep latency (8 minutes or less) on this MSLT. - No sleep onset REMs were noted during this MSLT. - Distinctly abnormal daytime somnolence is non specific without evidence of increased REM pressure on this study or the precediing night. Minor CPAP adjustment was made during the nocturnal titration. Nocturnal sleep was somewhat restless and fragmented, which may be typical for this patient.   DIAGNOSIS - Pathologic Sleepiness - Idiopathic hypersomnia (327.11 [G47.11 ICD-10])  RECOMMENDATIONS - Consider if a  therapeutic trial of sleep medication to consolidate night time sleep, planned naps, and appropriate use of daytime stimulant therapy would helpful. - Return for follow up to evaluate other causes of excessive daytime sleepiness.  [Electronically signed] 05/10/2019 12:23 PM  Baird Lyons MD, [place], American Board of Sleep Medicine   NPI: [id number]                        [place], [facility] of [facility]  ELECTRONICALLY SIGNED ON:  05/10/2019, 12:24 PM [place] PH: [phone]   FX: [phone] [place]

## 2019-05-10 NOTE — Procedures (Signed)
Patient Name: Jeremiah Flores, Jeremiah Flores Date: 05/04/2019 Gender: Male D.O.B: 1985-07-23 Age (years): 33 Referring Provider: Randa Lynn Height (inches): 72 Interpreting Physician: Baird Lyons MD, ABSM Weight (lbs): 204 RPSGT: Gwenyth Allegra BMI: 28 MRN: [id number] Neck Size: 17.00  CLINICAL INFORMATION The patient is referred for a CPAP titration to treat sleep apnea.  Date of NPSG, Split Night or HST:  PSG at [facility] 11/19/16  AHI 41.1/ hr, desaturation to 83%, titrated to CPAP 6  SLEEP STUDY TECHNIQUE As per the AASM Manual for the Scoring of Sleep and Associated Events v2.3 (April 2016) with a hypopnea requiring 4% desaturations.  The channels recorded and monitored were frontal, central and occipital EEG, electrooculogram (EOG), submentalis EMG (chin), nasal and oral airflow, thoracic and abdominal wall motion, anterior tibialis EMG, snore microphone, electrocardiogram, and pulse oximetry. Continuous positive airway pressure (CPAP) was initiated at the beginning of the study and titrated to treat sleep-disordered breathing.  MEDICATIONS Medications self-administered by patient taken the night of the study : none reported  TECHNICIAN COMMENTS Comments added by technician: Patient was restless all through the night. Comments added by scorer: N/A  RESPIRATORY PARAMETERS Optimal PAP Pressure (cm): 9 AHI at Optimal Pressure (/hr): 0.0 Overall Minimal O2 (%): 94.0 Supine % at Optimal Pressure (%): 50 Minimal O2 at Optimal Pressure (%): 94.0   SLEEP ARCHITECTURE The study was initiated at 10:39:17 PM and ended at 6:00:22 AM.  Sleep onset time was 8.8 minutes and the sleep efficiency was 71.6%%. The total sleep time was 316 minutes.  The patient spent 8.4%% of the night in stage N1 sleep, 75.0%% in stage N2 sleep, 3.6%% in stage N3 and 13% in REM.Stage REM latency was 161.5 minutes  Wake after sleep onset was 116.3. Alpha intrusion was absent. Supine sleep was 70.09%.   CARDIAC DATA The 2 lead EKG demonstrated sinus rhythm. The mean heart rate was 50.9 beats per minute. Other EKG findings include: None.  LEG MOVEMENT DATA The total Periodic Limb Movements of Sleep (PLMS) were 0. The PLMS index was 0.0. A PLMS index of <15 is considered normal in adults.  IMPRESSIONS - The optimal PAP pressure was 9 cm of water (AHI 0.0/ hr). - Central sleep apnea was not noted during this titration (CAI = 0.0/h). - Significant oxygen desaturations were not observed during this titration (min O2 = 94.0%). - The patient snored with soft snoring volume during this titration study. - No cardiac abnormalities were observed during this study. - Clinically significant periodic limb movements were not noted during this study. - Sleep was described by tech as restless through the night with frequent spontaneous arousals and awakenings with Total Sleep Time 316 minutes and sleep efficiency 71.6%.  DIAGNOSIS - Obstructive Sleep Apnea (327.23 [G47.33 ICD-10])  RECOMMENDATIONS - Trial of CPAP therapy on 9 cm H2O or autopap 5-12. - Patient used a Medium size Resmed Full Face Mask AirFit F10 mask and heated humidification. - Be careful with alcohol, sedatives and other CNS depressants that may worsen sleep apnea and disrupt normal sleep architecture. Treatment for insomnia may be appropriate. - Sleep hygiene should be reviewed to assess factors that may improve sleep quality. - Weight management and regular exercise should be initiated or continued. - See result of MSLT following this study.  [Electronically signed] 05/10/2019 12:08 PM  Baird Lyons MD, [place], [place] Board of Sleep Medicine   NPI: [id number]  [place], [employer] of Sleep Medicine  ELECTRONICALLY SIGNED ON:  05/10/2019, 11:59 AM [place] PH: [phone]   FX: [phone] [place]

## 2019-05-11 DIAGNOSIS — G4733 Obstructive sleep apnea (adult) (pediatric): Secondary | ICD-10-CM | POA: Diagnosis not present

## 2019-06-09 IMAGING — CR DG LUMBAR SPINE 2-3V
2 series · 2 of 2 positions shown · non-contrast
Comparison: none

[l-spine ap]
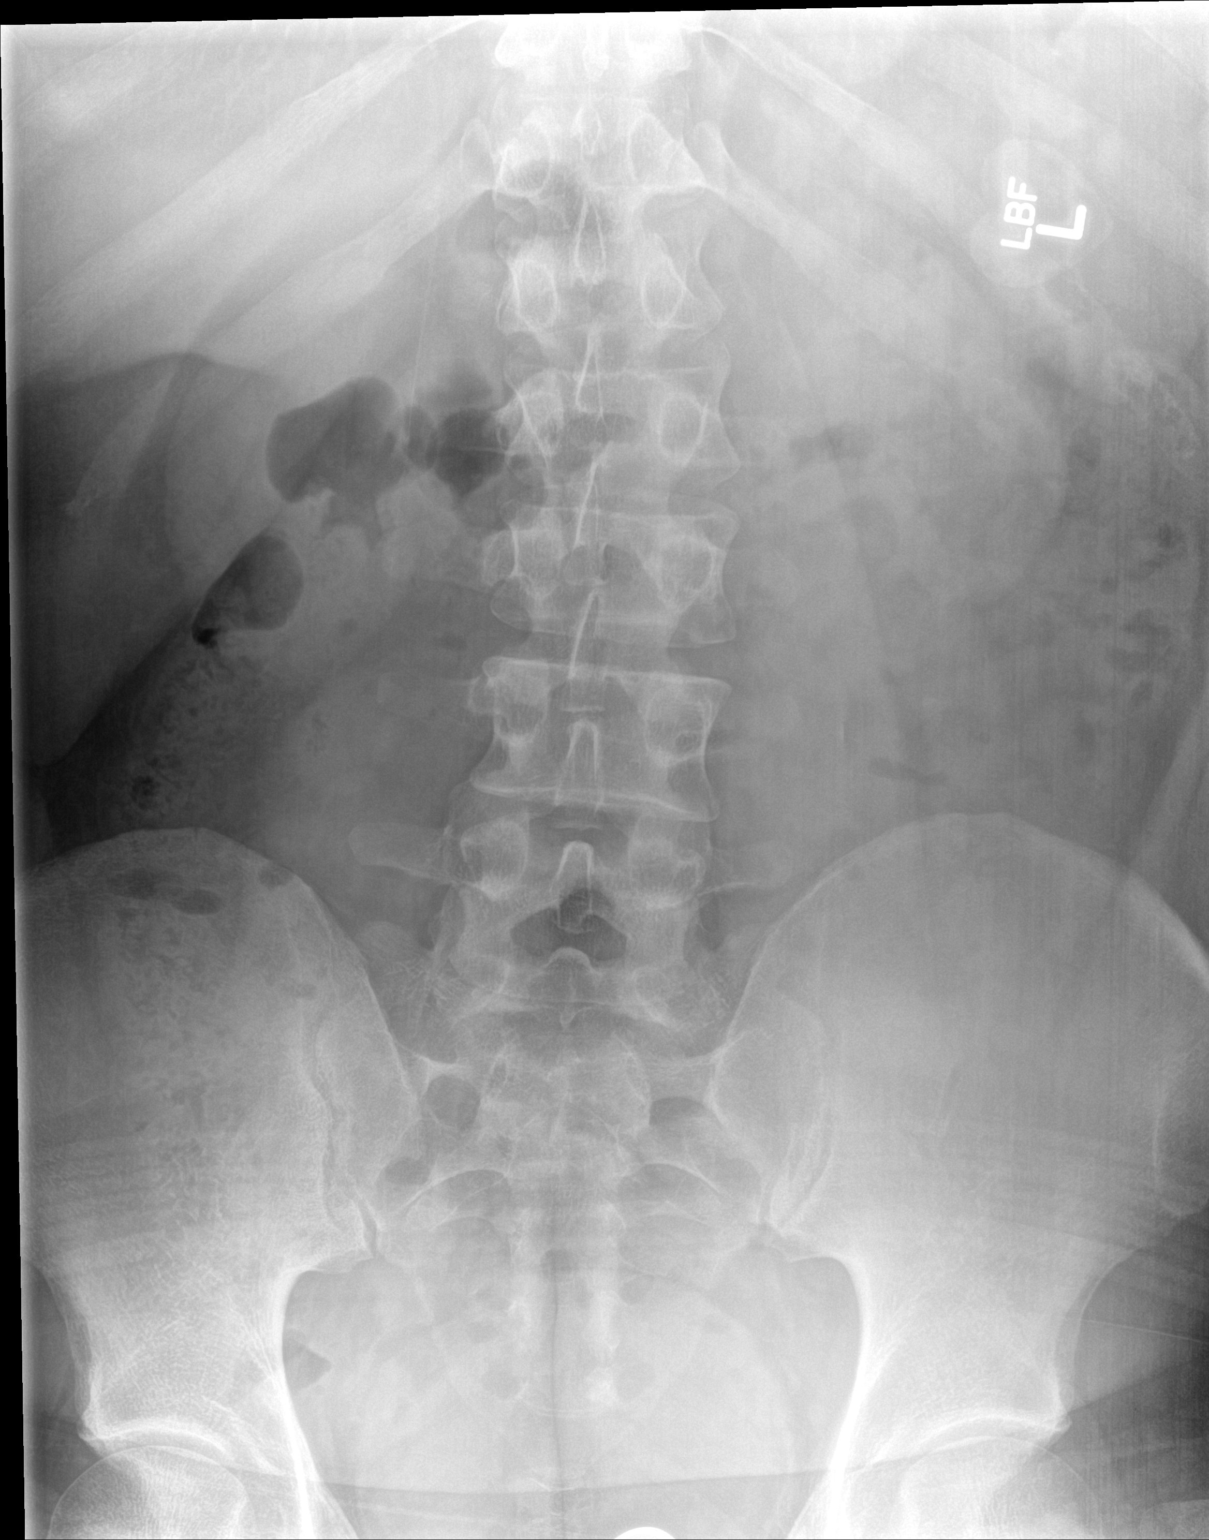

[l-spine lat]
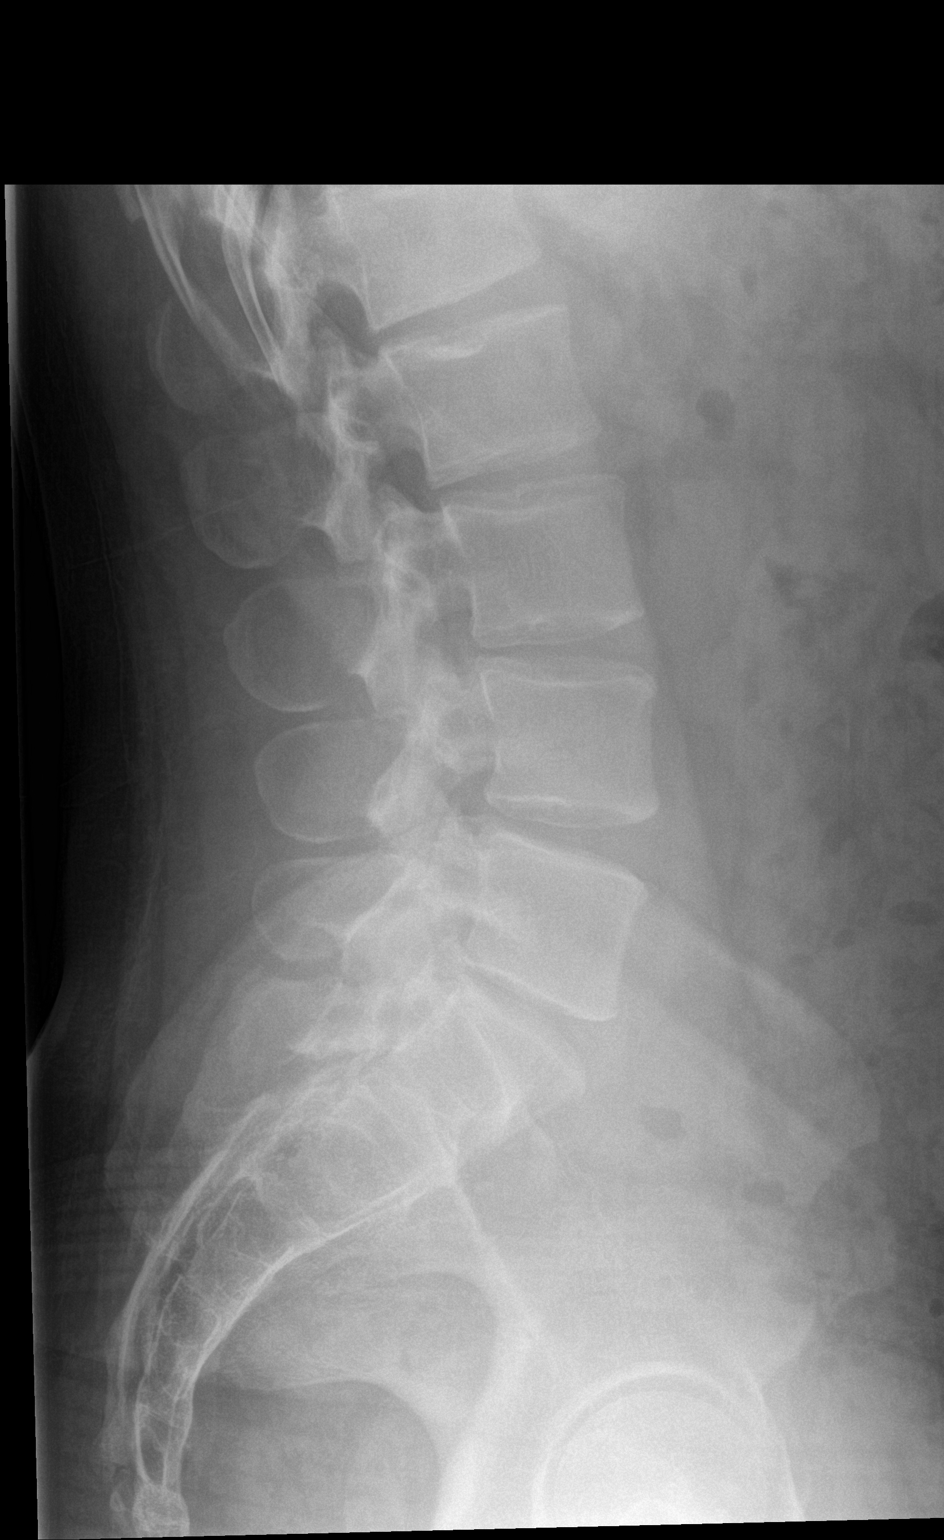

[2 of 2 positions shown; findings below may reference images not displayed]

Canned report from images found in remote index.

Refer to host system for actual result text.

## 2019-06-09 IMAGING — CR DG CERVICAL SPINE 2 OR 3 VIEWS
2 series · 2 of 2 positions shown · non-contrast
Comparison: none

[c-spine lat]
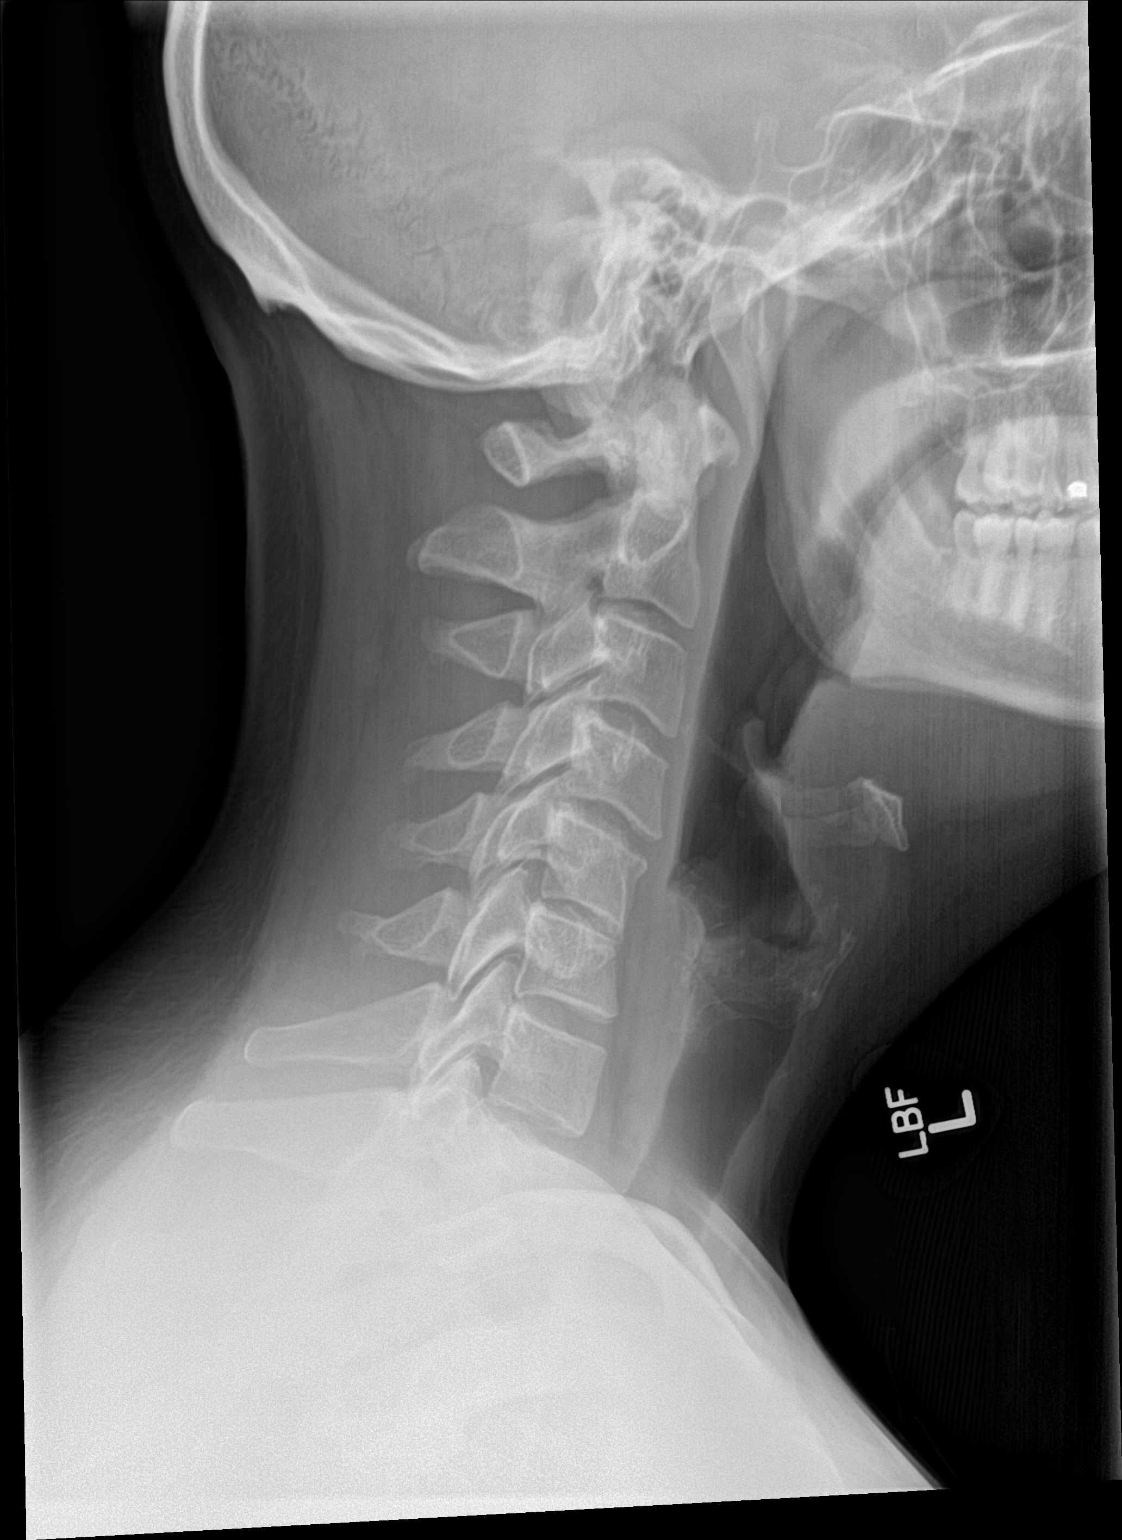

[c-spine ap]
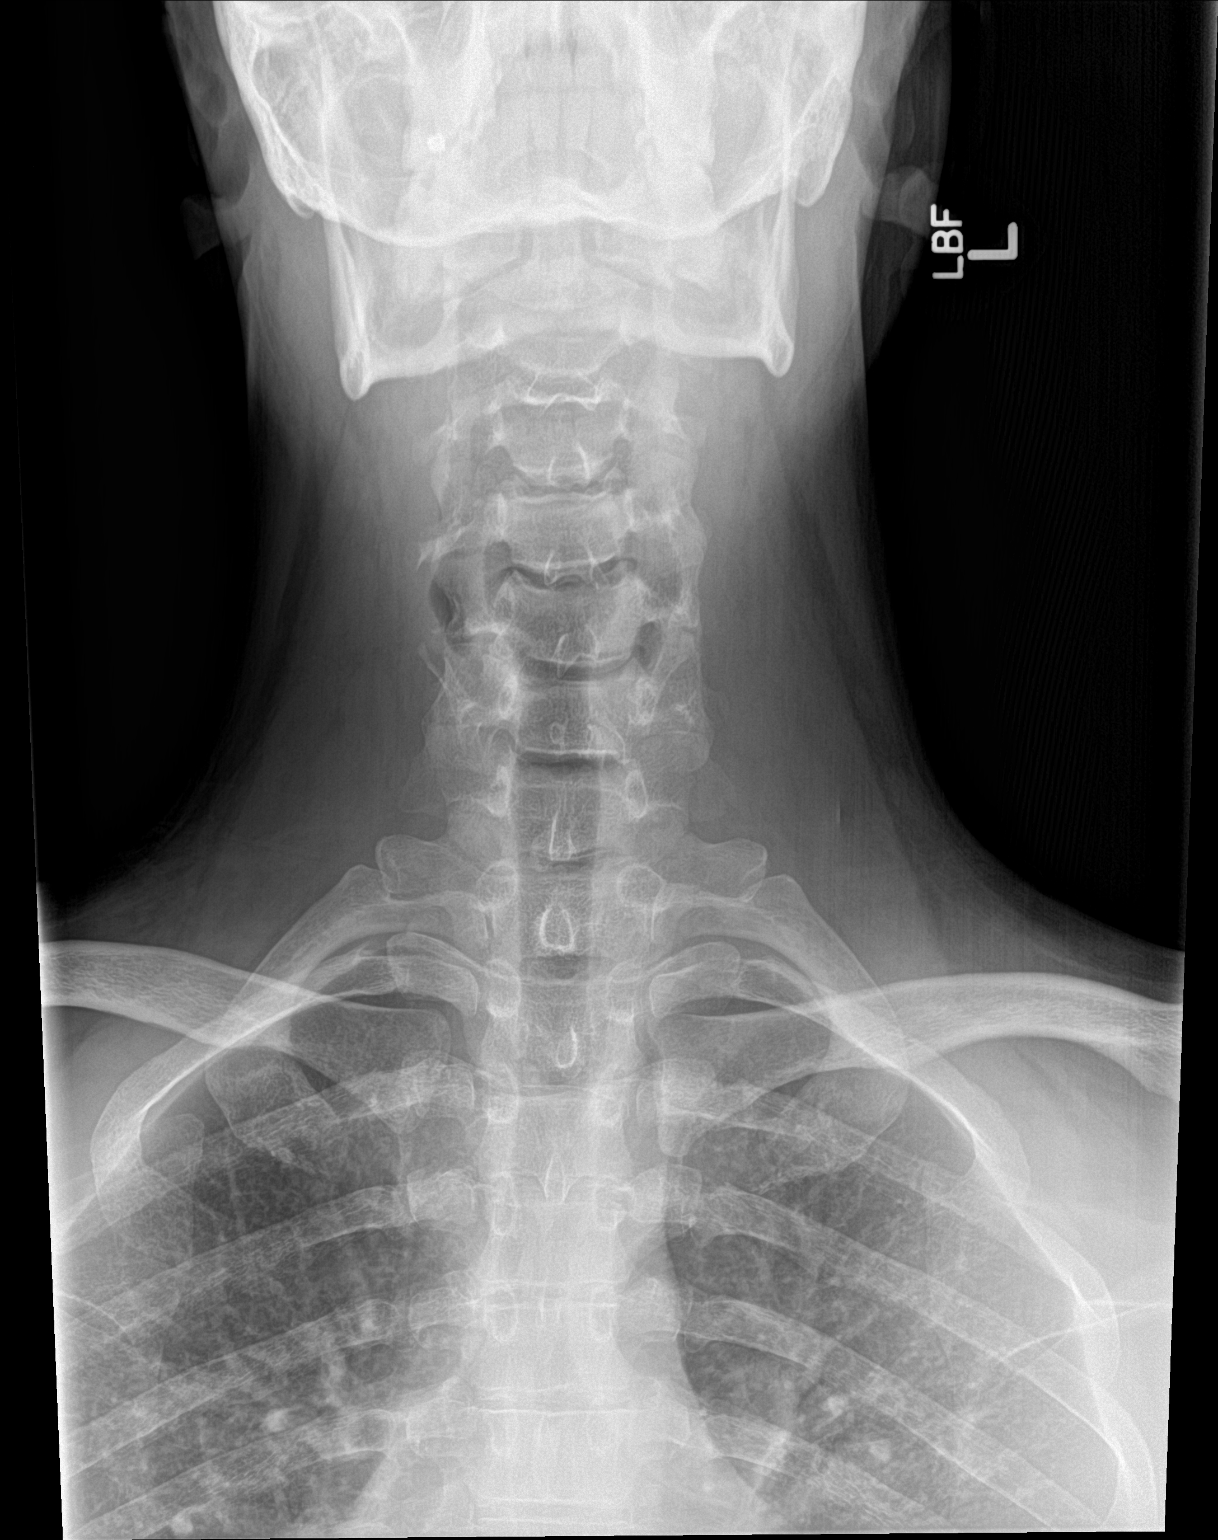

[2 of 2 positions shown; findings below may reference images not displayed]

Canned report from images found in remote index.

Refer to host system for actual result text.

## 2019-12-24 ENCOUNTER — Telehealth: Payer: Self-pay | Admitting: Internal Medicine

## 2019-12-24 NOTE — Telephone Encounter (Signed)
I spoke with pt, he has never been seen here by [facility] but stated that he read his report for his sleep test. He is wanting a letter for disability from [place] but I explained to him that he has never been seen here before and that [facility] probably will not write anything about his condition for disability. I advised him to speak to his PCP at the [place] and see if he can be referred to [place] for the sleep issue noted on his sleep test. Pt understood and will contact the [place]. Sill route to [facility] to advise.

## 2019-12-24 NOTE — Telephone Encounter (Signed)
You managed this correctly- thanks

## 2019-12-29 NOTE — Telephone Encounter (Signed)
Sent message to pt via mychart

## 2020-01-29 ENCOUNTER — Emergency Department (HOSPITAL_COMMUNITY): Payer: No Typology Code available for payment source

## 2020-01-29 ENCOUNTER — Emergency Department (HOSPITAL_COMMUNITY)
Admission: EM | Admit: 2020-01-29 | Discharge: 2020-01-29 | Disposition: A | Discharge status: Home / Self Care | Payer: No Typology Code available for payment source | Attending: Emergency Medicine | Admitting: Emergency Medicine

## 2020-01-29 ENCOUNTER — Other Ambulatory Visit: Payer: Self-pay

## 2020-01-29 DIAGNOSIS — F17200 Nicotine dependence, unspecified, uncomplicated: Secondary | ICD-10-CM | POA: Insufficient documentation

## 2020-01-29 DIAGNOSIS — X58XXXA Exposure to other specified factors, initial encounter: Secondary | ICD-10-CM | POA: Diagnosis not present

## 2020-01-29 DIAGNOSIS — S29012A Strain of muscle and tendon of back wall of thorax, initial encounter: Secondary | ICD-10-CM | POA: Diagnosis not present

## 2020-01-29 DIAGNOSIS — T148XXA Other injury of unspecified body region, initial encounter: Secondary | ICD-10-CM

## 2020-01-29 DIAGNOSIS — Y939 Activity, unspecified: Secondary | ICD-10-CM | POA: Diagnosis not present

## 2020-01-29 DIAGNOSIS — Y929 Unspecified place or not applicable: Secondary | ICD-10-CM | POA: Diagnosis not present

## 2020-01-29 DIAGNOSIS — Y999 Unspecified external cause status: Secondary | ICD-10-CM | POA: Diagnosis not present

## 2020-01-29 DIAGNOSIS — S3992XA Unspecified injury of lower back, initial encounter: Secondary | ICD-10-CM | POA: Diagnosis present

## 2020-01-29 MED ORDER — PREDNISONE 50 MG PO TABS: ORAL_TABLET | ORAL | 0 refills | Status: DC

## 2020-01-29 MED ORDER — HYDROCODONE-ACETAMINOPHEN 5-325 MG PO TABS: 1.0000 | ORAL_TABLET | ORAL | 0 refills | Status: AC | PRN

## 2020-01-29 NOTE — ED Triage Notes (Signed)
Pt having R sided back pain right below scapula radiating up to shoulder since Monday. Hx of chronic lower back pain but this pain is different. Denies injury.

## 2020-01-29 NOTE — Discharge Instructions (Signed)
See your Physician for recheck in 1 week if pain persist.  Return if any problems.

## 2020-01-29 NOTE — ED Provider Notes (Signed)
[facility] EMERGENCY DEPARTMENT Provider Note   CSN: [id number] Arrival date & time: 01/29/20  1057     History Chief Complaint  Patient presents with  . Back Pain    Jeremiah Flores is a 34 y.o. male.  The history is provided by the patient. No language interpreter was used.  Back Pain Location:  Thoracic spine Quality:  Aching Radiates to:  Does not radiate Pain severity:  Moderate Onset quality:  Gradual Duration:  1 week Timing:  Constant Progression:  Worsening Chronicity:  New Relieved by:  Nothing Worsened by:  Movement Ineffective treatments:  None tried Associated symptoms: no chest pain    Pt reports soreness in his back under right scapula     Past Medical History:  Diagnosis Date  . Acid reflux   . Alcoholism ([place])   . Anxiety   . Arthritis   . Bipolar disorder ([place])   . Depression   . Headache   . Heart murmur    AS CHILD  -   RESOLVED  . Personal history of colonic polyps   . Sleep apnea    CPAP 2018     Patient Active Problem List   Diagnosis Date Noted  . Bipolar disorder ([place]) 01/24/2018  . Sleep disturbance 01/24/2018  . Decreased libido 12/18/2017  . Excessive sweating 12/18/2017  . HNP (herniated nucleus pulposus), lumbar 09/17/2017  . 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 INNER EAR SURGERY Right   . LUMBAR LAMINECTOMY/DECOMPRESSION MICRODISCECTOMY N/A 09/17/2017   Procedure: RIGHT L5-S1 MICRODISCECTOMY;  Surgeon: Marybelle Killings, MD;  Location: [facility];  Service: Orthopedics;  Laterality: N/A;  . STOMACH SURGERY     PYLORIC STENOSIS  . TONSILLECTOMY         Family History  Problem Relation Age of Onset  . Arthritis Mother   . Asthma Mother   . Depression Mother   . COPD Mother   . Heart attack Mother   . Heart disease Mother   . Hypertension Mother   . Alcohol abuse Father   . Diabetes Father   . Early death Father   . Lung  cancer Father   . Cervical cancer Sister     Social History   Tobacco Use  . Smoking status: Current Every Day Smoker    Years: 9.00  . Smokeless tobacco: Never Used  Vaping Use  . Vaping Use: Every day  Substance Use Topics  . Alcohol use: No    Comment: HX ETOH ABUSE  SOBER >1 YEAR  . Drug use: No    Home Medications Prior to Admission medications   Medication Sig Start Date End Date Taking? Authorizing Provider  azelastine (OPTIVAR) 0.05 % ophthalmic solution INSTILL 1 DROP INTO AFFECTED EYE TWICE A DAY 12/13/17   [provider]  fluticasone (FLONASE) 50 MCG/ACT nasal spray SPRAY 1 SPRAY INTO EACH NOSTRIL EVERY DAY 12/12/17   [provider]  HYDROcodone-acetaminophen (NORCO/VICODIN) 5-325 MG tablet Take 1 tablet by mouth every 4 (four) hours as needed for up to 7 days for moderate pain. 01/29/20 02/05/20  Fransico Meadow, PA-C  levocetirizine (XYZAL) 5 MG tablet every evening. 12/10/17   [provider]  montelukast (SINGULAIR) 10 MG tablet Take 10 mg by mouth daily. 12/10/17   [provider]  omeprazole (PRILOSEC) 40 MG capsule Take 40 mg by mouth daily.    [provider]  predniSONE (DELTASONE)  50 MG tablet One tablet a day for 6 days 01/29/20   Fransico Meadow, PA-C  Vilazodone HCl 20 MG TABS Take 30 mg by mouth daily.     [provider]    Allergies    Proventil [albuterol], Shellfish allergy, and Sulfa antibiotics  Review of Systems   Review of Systems  Cardiovascular: Negative for chest pain.  Musculoskeletal: Positive for back pain.  All other systems reviewed and are negative.   Physical Exam Updated Vital Signs BP (!) 131/88 (BP Location: Right Arm)   Pulse 96   Temp 98 F (36.7 C) (Oral)   Resp 15   Ht 6' (1.829 m)   Wt (!) 104.3 kg   SpO2 96%   BMI 31.19 kg/m   Physical Exam Vitals reviewed.  Cardiovascular:     Rate and Rhythm: Normal rate.     Pulses: Normal pulses.  Pulmonary:     Effort:  Pulmonary effort is normal.     Breath sounds: Normal breath sounds.  Abdominal:     General: Abdomen is flat.  Musculoskeletal:        General: Normal range of motion.     Cervical back: Normal range of motion.     Comments: Tender thoracic spine area   Skin:    General: Skin is warm.     Findings: No rash.  Neurological:     General: No focal deficit present.  Psychiatric:        Mood and Affect: Mood normal.     ED Results / Procedures / Treatments   Labs (all labs ordered are listed, but only abnormal results are displayed) Labs Reviewed - No data to display  EKG None  Radiology DG Chest 2 View  Result Date: 01/29/2020 CLINICAL DATA:  Right-sided back pain and right shoulder pain. EXAM: CHEST - 2 VIEW COMPARISON:  None. FINDINGS: There is no evidence of acute infiltrate, pleural effusion or pneumothorax. The heart size and mediastinal contours are within normal limits. The visualized skeletal structures are unremarkable. IMPRESSION: No active cardiopulmonary disease. Electronically Signed   By: Virgina Norfolk M.D.   On: 01/29/2020 13:44    Procedures Procedures (including critical care time)  Medications Ordered in ED Medications - No data to display  ED Course  I have reviewed the triage vital signs and the nursing notes.  Pertinent labs & imaging results that were available during my care of the patient were reviewed by me and considered in my medical decision making (see chart for details).    MDM Rules/Calculators/A&P                         Chest xray is normal  I will try pt on prednisone   Final Clinical Impression(s) / ED Diagnoses Final diagnoses:  Muscle strain    Rx / DC Orders ED Discharge Orders         Ordered    predniSONE (DELTASONE) 50 MG tablet     Discontinue  Reprint     01/29/20 1422    HYDROcodone-acetaminophen (NORCO/VICODIN) 5-325 MG tablet  Every 4 hours PRN     Discontinue  Reprint     01/29/20 1422        An After Visit  Summary was printed and given to the patient.   Sidney Ace 01/29/20 1426    Davonna Belling, MD 01/29/20 [id number]

## 2020-08-19 ENCOUNTER — Telehealth: Payer: Self-pay | Admitting: Gastroenterology

## 2020-08-19 NOTE — Telephone Encounter (Signed)
Colonoscopy report July 2018, Dr. Reece Levy, [facility].  Indication "this is the patient's first colonoscopy, generalized abdominal pain, clinically significant diarrhea of unexplained origin."  Findings 10 mm polyp removed from the transverse colon with a hot snare.  4 mm polyp removed with biopsy forceps from the rectum.  The examination was otherwise normal  Upper endoscopy, July 2018, same day as the above colonoscopy.  Indication "endoscopy to assess diarrhea and patient suspected of having celiac disease, generalized abdominal pain, diarrhea."  Findings normal esophagus, minimal gastritis biopsy, normal examined duodenum biopsied.  There were no associated pathology reports from either of the above 2 procedures  I also reviewed a 37 page packet from the [place] that did not contain any more information than the above, specifically also did not contain the pathology reports from the above.      I cannot appropriately triage him to a colonoscopy until we have his previous biopsy reports from the 2018 colonoscopy.  Can you please see about getting those sent here and put on my desk?  Thank you

## 2020-08-19 NOTE — Telephone Encounter (Signed)
Hey Dr Ardis Hughs, this pt is being referred to [place] for a colonoscopy but it looks like they had one done in 2018, I will send the records for you to review, please advise on scheduling.

## 2020-10-19 DIAGNOSIS — M545 Low back pain, unspecified: Secondary | ICD-10-CM | POA: Insufficient documentation

## 2020-10-19 DIAGNOSIS — G8929 Other chronic pain: Secondary | ICD-10-CM | POA: Insufficient documentation

## 2020-11-16 ENCOUNTER — Ambulatory Visit (INDEPENDENT_AMBULATORY_CARE_PROVIDER_SITE_OTHER): Payer: No Typology Code available for payment source | Admitting: Gastroenterology

## 2020-11-16 ENCOUNTER — Encounter: Payer: Self-pay | Admitting: Gastroenterology

## 2020-11-16 ENCOUNTER — Other Ambulatory Visit: Payer: Self-pay

## 2020-11-16 VITALS — BP 129/84 | HR 89 | Temp 98.0°F | Ht 72.0 in | Wt 238.0 lb

## 2020-11-16 DIAGNOSIS — K5904 Chronic idiopathic constipation: Secondary | ICD-10-CM | POA: Diagnosis not present

## 2020-11-16 DIAGNOSIS — Z86010 Personal history of colonic polyps: Secondary | ICD-10-CM

## 2020-11-16 MED ORDER — NA SULFATE-K SULFATE-MG SULF 17.5-3.13-1.6 GM/177ML PO SOLN: 354.0000 mL | Freq: Once | ORAL | 0 refills | Status: AC

## 2020-11-16 NOTE — Patient Instructions (Signed)
High-Fiber Eating Plan Fiber, also called dietary fiber, is a type of carbohydrate. It is found foods such as fruits, vegetables, whole grains, and beans. A high-fiber diet can have many health benefits. Your health care provider may recommend a high-fiber diet to help:  Prevent constipation. Fiber can make your bowel movements more regular.  Lower your cholesterol.  Relieve the following conditions: ? Inflammation of veins in the anus (hemorrhoids). ? Inflammation of specific areas of the digestive tract (uncomplicated diverticulosis). ? A problem of the large intestine, also called the colon, that sometimes causes pain and diarrhea (irritable bowel syndrome, or IBS).  Prevent overeating as part of a weight-loss plan.  Prevent heart disease, type 2 diabetes, and certain cancers. What are tips for following this plan? Reading food labels  Check the nutrition facts label on food products for the amount of dietary fiber. Choose foods that have 5 grams of fiber or more per serving.  The goals for recommended daily fiber intake include: ? Men (age 50 or younger): 34-38 g. ? Men (over age 50): 28-34 g. ? Women (age 50 or younger): 25-28 g. ? Women (over age 50): 22-25 g. Your daily fiber goal is _____________ g.   Shopping  Choose whole fruits and vegetables instead of processed forms, such as apple juice or applesauce.  Choose a wide variety of high-fiber foods such as avocados, lentils, oats, and kidney beans.  Read the nutrition facts label of the foods you choose. Be aware of foods with added fiber. These foods often have high sugar and sodium amounts per serving. Cooking  Use whole-grain flour for baking and cooking.  Cook with brown rice instead of white rice. Meal planning  Start the day with a breakfast that is high in fiber, such as a cereal that contains 5 g of fiber or more per serving.  Eat breads and cereals that are made with whole-grain flour instead of refined  flour or white flour.  Eat brown rice, bulgur wheat, or millet instead of white rice.  Use beans in place of meat in soups, salads, and pasta dishes.  Be sure that half of the grains you eat each day are whole grains. General information  You can get the recommended daily intake of dietary fiber by: ? Eating a variety of fruits, vegetables, grains, nuts, and beans. ? Taking a fiber supplement if you are not able to take in enough fiber in your diet. It is better to get fiber through food than from a supplement.  Gradually increase how much fiber you consume. If you increase your intake of dietary fiber too quickly, you may have bloating, cramping, or gas.  Drink plenty of water to help you digest fiber.  Choose high-fiber snacks, such as berries, raw vegetables, nuts, and popcorn. What foods should I eat? Fruits Berries. Pears. Apples. Oranges. Avocado. Prunes and raisins. Dried figs. Vegetables Sweet potatoes. Spinach. Kale. Artichokes. Cabbage. Broccoli. Cauliflower. Green peas. Carrots. Squash. Grains Whole-grain breads. Multigrain cereal. Oats and oatmeal. Brown rice. Barley. Bulgur wheat. Millet. Quinoa. Bran muffins. Popcorn. Rye wafer crackers. Meats and other proteins Navy beans, kidney beans, and pinto beans. Soybeans. Split peas. Lentils. Nuts and seeds. Dairy Fiber-fortified yogurt. Beverages Fiber-fortified soy milk. Fiber-fortified orange juice. Other foods Fiber bars. The items listed above may not be a complete list of recommended foods and beverages. Contact a dietitian for more information. What foods should I avoid? Fruits Fruit juice. Cooked, strained fruit. Vegetables Fried potatoes. Canned vegetables. Well-cooked vegetables. Grains   White bread. Pasta made with refined flour. White rice. Meats and other proteins Fatty cuts of meat. Fried chicken or fried fish. Dairy Milk. Yogurt. Cream cheese. Sour cream. Fats and oils Butters. Beverages Soft  drinks. Other foods Cakes and pastries. The items listed above may not be a complete list of foods and beverages to avoid. Talk with your dietitian about what choices are best for you. Summary  Fiber is a type of carbohydrate. It is found in foods such as fruits, vegetables, whole grains, and beans.  A high-fiber diet has many benefits. It can help to prevent constipation, lower blood cholesterol, aid weight loss, and reduce your risk of heart disease, diabetes, and certain cancers.  Increase your intake of fiber gradually. Increasing fiber too quickly may cause cramping, bloating, and gas. Drink plenty of water while you increase the amount of fiber you consume.  The best sources of fiber include whole fruits and vegetables, whole grains, nuts, seeds, and beans. This information is not intended to replace advice given to you by your health care provider. Make sure you discuss any questions you have with your health care provider. Document Revised: 10/23/2019 Document Reviewed: 10/23/2019 Elsevier Patient Education  2021 Elsevier Inc.  

## 2020-11-16 NOTE — Progress Notes (Signed)
Jeremiah Darby, MD [address]  [place]  [place], [place] [address]  Main: [phone]  Fax: [phone]    Gastroenterology Consultation  Referring Provider:     Derek Jack, MD Primary Care Physician:  Pleas Koch, NP Primary Gastroenterologist:  Dr. Cephas Flores Reason for Consultation:     Personal history of colon polyps        HPI:   KIELAN DREISBACH is a 35 y.o. male referred by Dr. Carlis Abbott, Leticia Penna, NP  for consultation & management of personal history of colon polyps.  Patient is a veteran, underwent colonoscopy in 2018 for abdominal pain and diarrhea, found to have 1 cm polyp in the transverse colon that was resected with hot snare.  Pathology report not available.  Patient is referred by his PCP to discuss about surveillance colonoscopy given history of polyp.  Patient also reports that he has been experiencing irregular bowel habits which is chronic.  He thinks he makes poor food choices which contributes to constipation.  He does not take any stool softener on a regular basis  He does not smoke or drink alcohol  NSAIDs: None  Antiplts/Anticoagulants/Anti thrombotics: None  GI Procedures:  Colonoscopy report July 2018, Dr. Reece Levy, [facility].  Indication "this is the patient's first colonoscopy, generalized abdominal pain, clinically significant diarrhea of unexplained origin."  Findings 10 mm polyp removed from the transverse colon with a hot snare.  4 mm polyp removed with biopsy forceps from the rectum.  The examination was otherwise normal  Upper endoscopy, July 2018, same day as the above colonoscopy.  Indication "endoscopy to assess diarrhea and patient suspected of having celiac disease, generalized abdominal pain, diarrhea."  Findings normal esophagus, minimal gastritis biopsy, normal examined duodenum biopsied.  Pathology report not available  Past Medical History:  Diagnosis Date  . Acid reflux   . Alcoholism ([place])    . Anxiety   . Arthritis   . Bipolar disorder ([place])   . Depression   . Headache   . Heart murmur    AS CHILD  -   RESOLVED  . Personal history of colonic polyps   . Sleep apnea    CPAP 2018     Past Surgical History:  Procedure Laterality Date  . INNER EAR SURGERY Right   . LUMBAR LAMINECTOMY/DECOMPRESSION MICRODISCECTOMY N/A 09/17/2017   Procedure: RIGHT L5-S1 MICRODISCECTOMY;  Surgeon: Marybelle Killings, MD;  Location: [facility];  Service: Orthopedics;  Laterality: N/A;  . STOMACH SURGERY     PYLORIC STENOSIS  . TONSILLECTOMY      Current Outpatient Medications:  .  amLODipine (NORVASC) 5 MG tablet, Take by mouth., Disp: , Rfl:  .  azelastine (OPTIVAR) 0.05 % ophthalmic solution, INSTILL 1 DROP INTO AFFECTED EYE TWICE A DAY, Disp: , Rfl: 5 .  baclofen (LIORESAL) 10 MG tablet, , Disp: , Rfl:  .  busPIRone (BUSPAR) 10 MG tablet, take 3 tablets by oral route every day, Disp: , Rfl:  .  DULoxetine (CYMBALTA) 30 MG capsule, take 3 capsules by oral route  every day, Disp: , Rfl:  .  EPINEPHrine 0.3 mg/0.3 mL IJ SOAJ injection, Inject into the muscle., Disp: , Rfl:  .  fluticasone (FLONASE) 50 MCG/ACT nasal spray, SPRAY 1 SPRAY INTO EACH NOSTRIL EVERY DAY, Disp: , Rfl: 5 .  melatonin 3 MG TABS tablet, Take 6 mg by mouth at bedtime., Disp: , Rfl:  .  modafinil (PROVIGIL) 100 MG tablet, Take by  mouth., Disp: , Rfl:  .  omeprazole (PRILOSEC OTC) 20 MG tablet, Take by mouth., Disp: , Rfl:  .  simvastatin (ZOCOR) 10 MG tablet, take 1 tablet by oral route  every day, Disp: , Rfl:  .  traZODone (DESYREL) 150 MG tablet, take 1/2 tablet by oral route every day, Disp: , Rfl:  .  predniSONE (DELTASONE) 50 MG tablet, One tablet a day for 6 days (Patient not taking: Reported on 11/16/2020), Disp: 6 tablet, Rfl: 0   Family History  Problem Relation Age of Onset  . Arthritis Mother   . Asthma Mother   . Depression Mother   . COPD Mother   . Heart attack Mother   . Heart disease Mother   .  Hypertension Mother   . Alcohol abuse Father   . Diabetes Father   . Early death Father   . Lung cancer Father   . Cervical cancer Sister      Social History   Tobacco Use  . Smoking status: Current Every Day Smoker    Years: 9.00  . Smokeless tobacco: Never Used  Vaping Use  . Vaping Use: Every day  Substance Use Topics  . Alcohol use: Yes    Comment: HX ETOH ABUSE  SOBER >1 YEAR. 11/16/2020 social   . Drug use: No    Allergies as of 11/16/2020 - Review Complete 11/16/2020  Allergen Reaction Noted  . Proventil [albuterol]  09/07/2017  . Shellfish allergy Other (See Comments) 01/29/2020  . Sulfa antibiotics  09/07/2017    Review of Systems:    All systems reviewed and negative except where noted in HPI.   Physical Exam:  BP 129/84 (BP Location: Left Arm, Patient Position: Sitting, Cuff Size: Normal)   Pulse 89   Temp 98 F (36.7 C) (Oral)   Ht 6' (1.829 m)   Wt 238 lb (108 kg)   BMI 32.28 kg/m  No LMP for male patient.  General:   Alert,  Well-developed, well-nourished, pleasant and cooperative in NAD Head:  Normocephalic and atraumatic. Eyes:  Sclera clear, no icterus.   Conjunctiva pink. Ears:  Normal auditory acuity. Nose:  No deformity, discharge, or lesions. Mouth:  No deformity or lesions,oropharynx pink & moist. Neck:  Supple; no masses or thyromegaly. Lungs:  Respirations even and unlabored.  Clear throughout to auscultation.   No wheezes, crackles, or rhonchi. No acute distress. Heart:  Regular rate and rhythm; no murmurs, clicks, rubs, or gallops. Abdomen:  Normal bowel sounds. Soft, non-tender and non-distended without masses, hepatosplenomegaly or hernias noted.  No guarding or rebound tenderness.   Rectal: Not performed Msk:  Symmetrical without gross deformities. Good, equal movement & strength bilaterally. Pulses:  Normal pulses noted. Extremities:  No clubbing or edema.  No cyanosis. Neurologic:  Alert and oriented x3;  grossly normal  neurologically. Skin:  Intact without significant lesions or rashes. No jaundice. Psych:  Alert and cooperative. Normal mood and affect.  Imaging Studies: No abdominal imaging  Assessment and Plan:   DAINEL ARCIDIACONO is a 35 y.o. Caucasian male with chronic idiopathic constipation, personal history of 1 cm colon polyp  Chronic idiopathic constipation Discussed about high-fiber diet, information provided Discussed about trial of stool softener such as MiraLAX regularly Adequate intake of water  Personal history of colon polyps Recommend surveillance colonoscopy with 2-day prep  I have discussed alternative options, risks & benefits,  which include, but are not limited to, bleeding, infection, perforation,respiratory complication & drug reaction.  The patient  agrees with this plan & written consent will be obtained.     Follow up as needed   Jeremiah Darby, MD

## 2020-11-17 DIAGNOSIS — Z86010 Personal history of colonic polyps: Secondary | ICD-10-CM | POA: Insufficient documentation

## 2020-11-17 DIAGNOSIS — F419 Anxiety disorder, unspecified: Secondary | ICD-10-CM | POA: Insufficient documentation

## 2020-11-17 DIAGNOSIS — R030 Elevated blood-pressure reading, without diagnosis of hypertension: Secondary | ICD-10-CM | POA: Insufficient documentation

## 2020-11-17 DIAGNOSIS — Z0001 Encounter for general adult medical examination with abnormal findings: Secondary | ICD-10-CM | POA: Insufficient documentation

## 2020-11-17 DIAGNOSIS — F1020 Alcohol dependence, uncomplicated: Secondary | ICD-10-CM | POA: Insufficient documentation

## 2020-11-22 ENCOUNTER — Telehealth: Payer: Self-pay | Admitting: Gastroenterology

## 2020-11-22 NOTE — Telephone Encounter (Signed)
Please call to reschedule his procedure for 12/01/20.

## 2020-11-22 NOTE — Telephone Encounter (Signed)
Patient states he would like to be moved to 12/09/2020

## 2020-12-08 ENCOUNTER — Encounter: Payer: Self-pay | Admitting: Gastroenterology

## 2020-12-09 ENCOUNTER — Encounter: Payer: Self-pay | Admitting: Gastroenterology

## 2020-12-09 ENCOUNTER — Ambulatory Visit: Payer: No Typology Code available for payment source | Admitting: Certified Registered"

## 2020-12-09 ENCOUNTER — Encounter
Admission: RE | Disposition: A | Discharge status: Home / Self Care | Payer: Self-pay | Source: Ambulatory Visit | Attending: Gastroenterology

## 2020-12-09 ENCOUNTER — Ambulatory Visit
Admission: RE | Admit: 2020-12-09 | Discharge: 2020-12-09 | Disposition: A | Discharge status: Home / Self Care | Payer: No Typology Code available for payment source | Source: Ambulatory Visit | Attending: Gastroenterology | Admitting: Gastroenterology

## 2020-12-09 DIAGNOSIS — Z79899 Other long term (current) drug therapy: Secondary | ICD-10-CM | POA: Diagnosis not present

## 2020-12-09 DIAGNOSIS — Z1211 Encounter for screening for malignant neoplasm of colon: Secondary | ICD-10-CM | POA: Diagnosis not present

## 2020-12-09 DIAGNOSIS — F17210 Nicotine dependence, cigarettes, uncomplicated: Secondary | ICD-10-CM | POA: Diagnosis not present

## 2020-12-09 DIAGNOSIS — Z888 Allergy status to other drugs, medicaments and biological substances status: Secondary | ICD-10-CM | POA: Insufficient documentation

## 2020-12-09 DIAGNOSIS — K635 Polyp of colon: Secondary | ICD-10-CM | POA: Insufficient documentation

## 2020-12-09 DIAGNOSIS — Z882 Allergy status to sulfonamides status: Secondary | ICD-10-CM | POA: Insufficient documentation

## 2020-12-09 DIAGNOSIS — Z86010 Personal history of colonic polyps: Secondary | ICD-10-CM | POA: Insufficient documentation

## 2020-12-09 DIAGNOSIS — D120 Benign neoplasm of cecum: Secondary | ICD-10-CM | POA: Insufficient documentation

## 2020-12-09 HISTORY — PX: COLONOSCOPY WITH PROPOFOL: SHX5780

## 2020-12-09 SURGERY — COLONOSCOPY WITH PROPOFOL
Anesthesia: General

## 2020-12-09 MED ORDER — PROPOFOL 500 MG/50ML IV EMUL
INTRAVENOUS | Status: AC
  Filled 2020-12-09: qty 50

## 2020-12-09 MED ORDER — PROPOFOL 500 MG/50ML IV EMUL
INTRAVENOUS | Status: DC | PRN
  Administered 2020-12-09: 150 ug/kg/min via INTRAVENOUS

## 2020-12-09 MED ORDER — SODIUM CHLORIDE 0.9 % IV SOLN: INTRAVENOUS | Status: DC

## 2020-12-09 MED ORDER — MIDAZOLAM HCL 2 MG/2ML IJ SOLN
INTRAMUSCULAR | Status: AC
  Filled 2020-12-09: qty 2

## 2020-12-09 MED ORDER — MIDAZOLAM HCL 2 MG/2ML IJ SOLN
INTRAMUSCULAR | Status: DC | PRN
  Administered 2020-12-09: 2 mg via INTRAVENOUS

## 2020-12-09 MED ORDER — LIDOCAINE HCL (CARDIAC) PF 100 MG/5ML IV SOSY
PREFILLED_SYRINGE | INTRAVENOUS | Status: DC | PRN
  Administered 2020-12-09: 50 mg via INTRAVENOUS

## 2020-12-09 MED ORDER — PROPOFOL 10 MG/ML IV BOLUS
INTRAVENOUS | Status: DC | PRN
  Administered 2020-12-09: 20 mg via INTRAVENOUS
  Administered 2020-12-09: 70 mg via INTRAVENOUS

## 2020-12-09 NOTE — Op Note (Signed)
[facility]enterology Patient Name: Jeremiah Flores Procedure Date: 12/09/2020 10:20 AM MRN: [id number] Account #: [id number] Date of Birth: 1985-08-06 Admit Type: Outpatient Age: 35 Room: [facility] ENDO ROOM 4 Gender: Male Note Status: Finalized Procedure:             Colonoscopy Indications:           High risk colon cancer surveillance: Personal history                         of colonic polyps, Surveillance: Personal history of                         colonic polyps (unknown histology) on last colonoscopy                         more than 3 years ago Providers:             Lin Landsman MD, MD Referring MD:          No Local Md, MD (Referring MD) Medicines:             General Anesthesia Complications:         No immediate complications. Estimated blood loss: None. Procedure:             Pre-Anesthesia Assessment:                        - Prior to the procedure, a History and Physical was                         performed, and patient medications and allergies were                         reviewed. The patient is competent. The risks and                         benefits of the procedure and the sedation options and                         risks were discussed with the patient. All questions                         were answered and informed consent was obtained.                         Patient identification and proposed procedure were                         verified by the physician, the nurse, the                         anesthesiologist, the anesthetist and the technician                         in the pre-procedure area in the procedure room in the                         endoscopy suite. Mental Status Examination: alert and  oriented. Airway Examination: normal oropharyngeal                         airway and neck mobility. Respiratory Examination:                         clear to auscultation. CV Examination: normal.                          Prophylactic Antibiotics: The patient does not require                         prophylactic antibiotics. Prior Anticoagulants: The                         patient has taken no previous anticoagulant or                         antiplatelet agents. ASA Grade Assessment: II - A                         patient with mild systemic disease. After reviewing                         the risks and benefits, the patient was deemed in                         satisfactory condition to undergo the procedure. The                         anesthesia plan was to use general anesthesia.                         Immediately prior to administration of medications,                         the patient was re-assessed for adequacy to receive                         sedatives. The heart rate, respiratory rate, oxygen                         saturations, blood pressure, adequacy of pulmonary                         ventilation, and response to care were monitored                         throughout the procedure. The physical status of the                         patient was re-assessed after the procedure.                        After obtaining informed consent, the colonoscope was                         passed under direct vision. Throughout the procedure,  the patient's blood pressure, pulse, and oxygen                         saturations were monitored continuously. The                         Colonoscope was introduced through the anus and                         advanced to the the cecum, identified by appendiceal                         orifice and ileocecal valve. The colonoscopy was                         performed without difficulty. The patient tolerated                         the procedure well. The quality of the bowel                         preparation was evaluated using the BBPS [facility] Bowel                         Preparation Scale) with scores of: Right Colon  = 3,                         Transverse Colon = 3 and Left Colon = 3 (entire mucosa                         seen well with no residual staining, small fragments                         of stool or opaque liquid). The total BBPS score                         equals 9. Findings:      The perianal and digital rectal examinations were normal. Pertinent       negatives include normal sphincter tone and no palpable rectal lesions.      Three sessile polyps were found in the cecum. The polyps were 2 to 3 mm       in size. These polyps were removed with a jumbo cold forceps. Resection       and retrieval were complete.      The exam was otherwise without abnormality.      The retroflexed view of the distal rectum and anal verge was normal and       showed no anal or rectal abnormalities. Impression:            - Three 2 to 3 mm polyps in the cecum, removed with a                         jumbo cold forceps. Resected and retrieved.                        - The examination was otherwise normal.                        -  The distal rectum and anal verge are normal on                         retroflexion view. Recommendation:        - Discharge patient to home (with escort).                        - Resume previous diet today.                        - Continue present medications.                        - Await pathology results.                        - Repeat colonoscopy in 5-10 years for surveillance                         based on pathology results. Procedure Code(s):     --- Professional ---                        [phone], Colonoscopy, flexible; with biopsy, single or                         multiple Diagnosis Code(s):     --- Professional ---                        Z86.010, Personal history of colonic polyps                        K63.5, Polyp of colon CPT copyright 2019 American Medical Association. All rights reserved. The codes documented in this report are preliminary and upon coder review  may  be revised to meet current compliance requirements. Dr. Ulyess Mort Lin Landsman MD, MD 12/09/2020 10:42:28 AM This report has been signed electronically. Number of Addenda: 0 Note Initiated On: 12/09/2020 10:20 AM Scope Withdrawal Time: 0 hours 8 minutes 59 seconds  Total Procedure Duration: 0 hours 10 minutes 13 seconds  Estimated Blood Loss:  Estimated blood loss: none.      [facility]

## 2020-12-09 NOTE — Anesthesia Preprocedure Evaluation (Signed)
Anesthesia Evaluation  Patient identified by MRN, date of birth, ID band Patient awake    Reviewed: Allergy & Precautions, H&P , NPO status , Patient's Chart, lab work & pertinent test results  History of Anesthesia Complications Negative for: history of anesthetic complications  Airway Mallampati: I  TM Distance: >3 FB Neck ROM: Full    Dental no notable dental hx. (+) Teeth Intact, Dental Advisory Given   Pulmonary sleep apnea and Continuous Positive Airway Pressure Ventilation , neg COPD, Current SmokerPatient did not abstain from smoking.,    Pulmonary exam normal breath sounds clear to auscultation       Cardiovascular Exercise Tolerance: Good METShypertension, (-) CAD and (-) Past MI (-) dysrhythmias  Rhythm:Regular Rate:Normal     Neuro/Psych  Headaches, PSYCHIATRIC DISORDERS Anxiety Depression Bipolar Disorder  Neuromuscular disease    GI/Hepatic Neg liver ROS, GERD  Medicated and Controlled,  Endo/Other  negative endocrine ROSneg diabetes  Renal/GU negative Renal ROS  negative genitourinary   Musculoskeletal  (+) Arthritis , Osteoarthritis,    Abdominal   Peds  Hematology negative hematology ROS (+)   Anesthesia Other Findings Past Medical History: No date: Acid reflux No date: Alcoholism ([place]) No date: Anxiety No date: Arthritis No date: Bipolar disorder (HCC) No date: Depression No date: Headache No date: Heart murmur     Comment:  AS CHILD  -   RESOLVED No date: Personal history of colonic polyps No date: Sleep apnea     Comment:  CPAP 2018   Reproductive/Obstetrics negative OB ROS                             Anesthesia Physical  Anesthesia Plan  ASA: 2  Anesthesia Plan: General   Post-op Pain Management:    Induction: Intravenous  PONV Risk Score and Plan: 3 and Ondansetron, Propofol infusion and TIVA  Airway Management Planned: Nasal Cannula and Natural  Airway  Additional Equipment: None  Intra-op Plan:   Post-operative Plan: Extubation in OR  Informed Consent: I have reviewed the patients History and Physical, chart, labs and discussed the procedure including the risks, benefits and alternatives for the proposed anesthesia with the patient or authorized representative who has indicated his/her understanding and acceptance.     Dental advisory given  Plan Discussed with: CRNA  Anesthesia Plan Comments: (Discussed risks of anesthesia with patient, including possibility of difficulty with spontaneous ventilation under anesthesia necessitating airway intervention, PONV, and rare risks such as cardiac or respiratory or neurological events. Patient understands.)        Anesthesia Quick Evaluation

## 2020-12-09 NOTE — Transfer of Care (Signed)
Immediate Anesthesia Transfer of Care Note  Patient: Jeremiah Flores  Procedure(s) Performed: COLONOSCOPY WITH PROPOFOL  Patient Location: PACU and Endoscopy Unit  Anesthesia Type:General  Level of Consciousness: drowsy and patient cooperative  Airway & Oxygen Therapy: Patient Spontanous Breathing  Post-op Assessment: Report given to RN and Post -op Vital signs reviewed and stable  Post vital signs: Reviewed and stable  Last Vitals:  Vitals Value Taken Time  BP 101/65 12/09/20 1042  Temp    Pulse 69 12/09/20 1042  Resp 21 12/09/20 1042  SpO2 93 % 12/09/20 1042  Vitals shown include unvalidated device data.  Last Pain:  Vitals:   12/09/20 0920  TempSrc: Temporal  PainSc: 0-No pain         Complications: No notable events documented.

## 2020-12-09 NOTE — H&P (Signed)
Cephas Darby, MD [address]  [place]  [place], [place] [address]  Main: [phone]  Fax: [phone] Pager: [phone]  Primary Care Physician:  Lenise Arena, FNP Primary Gastroenterologist:  Dr. Cephas Darby  Pre-Procedure History & Physical: HPI:  Jeremiah Flores is a 35 y.o. male is here for an colonoscopy.   Past Medical History:  Diagnosis Date   Acid reflux    Alcoholism ([place])    Anxiety    Arthritis    Bipolar disorder (HCC)    Depression    Headache    Heart murmur    AS CHILD  -   RESOLVED   Personal history of colonic polyps    Sleep apnea    CPAP 2018     Past Surgical History:  Procedure Laterality Date   INNER EAR SURGERY Right    LUMBAR LAMINECTOMY/DECOMPRESSION MICRODISCECTOMY N/A 09/17/2017   Procedure: RIGHT L5-S1 MICRODISCECTOMY;  Surgeon: Marybelle Killings, MD;  Location: [facility];  Service: Orthopedics;  Laterality: N/A;   STOMACH SURGERY     PYLORIC STENOSIS   TONSILLECTOMY      Prior to Admission medications   Medication Sig Start Date End Date Taking? Authorizing Provider  amLODipine (NORVASC) 5 MG tablet Take by mouth. 08/16/20  Yes [provider]  azelastine (OPTIVAR) 0.05 % ophthalmic solution INSTILL 1 DROP INTO AFFECTED EYE TWICE A DAY 12/13/17  Yes [provider]  baclofen (LIORESAL) 10 MG tablet  08/16/20  Yes [provider]  busPIRone (BUSPAR) 10 MG tablet take 3 tablets by oral route every day 08/20/20  Yes [provider]  DULoxetine (CYMBALTA) 30 MG capsule take 3 capsules by oral route  every day 08/20/20  Yes [provider]  fluticasone (FLONASE) 50 MCG/ACT nasal spray SPRAY 1 SPRAY INTO EACH NOSTRIL EVERY DAY 12/12/17  Yes [provider]  modafinil (PROVIGIL) 100 MG tablet Take by mouth. 08/20/20  Yes [provider]  omeprazole (PRILOSEC OTC) 20 MG tablet Take by mouth.   Yes [provider]  simvastatin (ZOCOR) 10 MG tablet take 1 tablet by  oral route  every day   Yes [provider]  EPINEPHrine 0.3 mg/0.3 mL IJ SOAJ injection Inject into the muscle. 08/16/20   [provider]  melatonin 3 MG TABS tablet Take 6 mg by mouth at bedtime.    [provider]  predniSONE (DELTASONE) 50 MG tablet One tablet a day for 6 days Patient not taking: Reported on 11/16/2020 01/29/20   Fransico Meadow, PA-C  traZODone (DESYREL) 150 MG tablet take 1/2 tablet by oral route every day 08/20/20   [provider]    Allergies as of 11/16/2020 - Review Complete 11/16/2020  Allergen Reaction Noted   Proventil [albuterol]  09/07/2017   Shellfish allergy Other (See Comments) 01/29/2020   Sulfa antibiotics  09/07/2017    Family History  Problem Relation Age of Onset   Arthritis Mother    Asthma Mother    Depression Mother    COPD Mother    Heart attack Mother    Heart disease Mother    Hypertension Mother    Alcohol abuse Father    Diabetes Father    Early death Father    Lung cancer Father    Cervical cancer Sister     Social History   Socioeconomic History   Marital status: Married    Spouse name: Not on file   Number of children: Not on file  Years of education: Not on file   Highest education level: Not on file  Occupational History   Not on file  Tobacco Use   Smoking status: Every Day    Years: 9.00    Pack years: 0.00    Types: Cigarettes   Smokeless tobacco: Never  Vaping Use   Vaping Use: Every day  Substance and Sexual Activity   Alcohol use: Yes    Comment: HX ETOH ABUSE  SOBER >1 YEAR. 11/16/2020 social    Drug use: No   Sexual activity: Not on file  Other Topics Concern   Not on file  Social History Narrative   Married.    No children.   Works as a Ship broker.    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    Enjoys playing music, playing video games, playing music.    Social Determinants of Health   Financial Resource Strain:  Not on file  Food Insecurity: Not on file  Transportation Needs: Not on file  Physical Activity: Not on file  Stress: Not on file  Social Connections: Not on file  Intimate Partner Violence: Not on file    Review of Systems: See HPI, otherwise negative ROS  Physical Exam: BP 129/85   Pulse 73   Temp 97.6 F (36.4 C) (Temporal)   Resp 20   Ht 6' (1.829 m)   Wt 102.1 kg   SpO2 98%   BMI 30.52 kg/m  General:   Alert,  pleasant and cooperative in NAD Head:  Normocephalic and atraumatic. Neck:  Supple; no masses or thyromegaly. Lungs:  Clear throughout to auscultation.    Heart:  Regular rate and rhythm. Abdomen:  Soft, nontender and nondistended. Normal bowel sounds, without guarding, and without rebound.   Neurologic:  Alert and  oriented x4;  grossly normal neurologically.  Impression/Plan: Jeremiah Flores is here for an colonoscopy to be performed for h/o colon polyps  Risks, benefits, limitations, and alternatives regarding  colonoscopy have been reviewed with the patient.  Questions have been answered.  All parties agreeable.   Sherri Sear, MD  12/09/2020, 10:09 AM

## 2020-12-09 NOTE — Anesthesia Postprocedure Evaluation (Signed)
Anesthesia Post Note  Patient: Jeremiah Flores  Procedure(s) Performed: COLONOSCOPY WITH PROPOFOL  Patient location during evaluation: Endoscopy Anesthesia Type: General Level of consciousness: awake and alert Pain management: pain level controlled Vital Signs Assessment: post-procedure vital signs reviewed and stable Respiratory status: spontaneous breathing, nonlabored ventilation, respiratory function stable and patient connected to nasal cannula oxygen Cardiovascular status: blood pressure returned to baseline and stable Postop Assessment: no apparent nausea or vomiting Anesthetic complications: no   No notable events documented.   Last Vitals:  Vitals:   12/09/20 0920 12/09/20 1040  BP: 129/85 101/65  Pulse: 73 68  Resp: 20 20  Temp: 36.4 C   SpO2: 98% 93%    Last Pain:  Vitals:   12/09/20 0920  TempSrc: Temporal  PainSc: 0-No pain                 Arita Miss

## 2020-12-10 LAB — SURGICAL PATHOLOGY

## 2020-12-14 ENCOUNTER — Encounter: Payer: Self-pay | Admitting: Gastroenterology

## 2021-12-15 ENCOUNTER — Ambulatory Visit (HOSPITAL_COMMUNITY)
Admission: EM | Admit: 2021-12-15 | Discharge: 2021-12-15 | Disposition: A | Discharge status: Home / Self Care | Payer: No Typology Code available for payment source | Attending: Internal Medicine | Admitting: Internal Medicine

## 2021-12-15 ENCOUNTER — Encounter (HOSPITAL_COMMUNITY): Payer: Self-pay

## 2021-12-15 DIAGNOSIS — J069 Acute upper respiratory infection, unspecified: Secondary | ICD-10-CM | POA: Diagnosis not present

## 2021-12-15 DIAGNOSIS — R051 Acute cough: Secondary | ICD-10-CM

## 2021-12-15 DIAGNOSIS — F17200 Nicotine dependence, unspecified, uncomplicated: Secondary | ICD-10-CM | POA: Diagnosis not present

## 2021-12-15 LAB — POCT RAPID STREP A, ED / UC: Streptococcus, Group A Screen (Direct): NEGATIVE

## 2021-12-15 MED ORDER — DOXYCYCLINE HYCLATE 100 MG PO CAPS: 100.0000 mg | ORAL_CAPSULE | Freq: Two times a day (BID) | ORAL | 0 refills | Status: AC

## 2021-12-15 MED ORDER — PROMETHAZINE-DM 6.25-15 MG/5ML PO SYRP: 5.0000 mL | ORAL_SOLUTION | Freq: Four times a day (QID) | ORAL | 0 refills | Status: AC | PRN

## 2021-12-15 NOTE — ED Provider Notes (Signed)
[facility]    CSN: [id number] Arrival date & time: 12/15/21  1018      History   Chief Complaint Chief Complaint  Patient presents with   Cough    HPI Jeremiah Flores is a 36 y.o. male.   Jeremiah Flores, 36 year old male patient, presents to urgent care chief complaint of nasal congestion sore throat cough for over 1 month.  Patient had 2 negative COVID test at home, treating with over-the-counter meds without relief.  Patient is a smoker.  The history is provided by the patient. No language interpreter was used.    Past Medical History:  Diagnosis Date   Acid reflux    Alcoholism ([place])    Anxiety    Arthritis    Bipolar disorder ([place])    Depression    Headache    Heart murmur    AS CHILD  -   RESOLVED   Personal history of colonic polyps    Sleep apnea    CPAP 2018     Patient Active Problem List   Diagnosis Date Noted   Smoker 12/15/2021   Acute upper respiratory infection 12/15/2021   Hx of colonic polyps 11/17/2020   Alcohol dependence ([place]) 11/17/2020   Anxiety 11/17/2020   Elevated blood-pressure reading without diagnosis of hypertension 11/17/2020   Encounter for general adult medical examination with abnormal findings 11/17/2020   Chronic low back pain 10/19/2020   Bipolar disorder ([place]) 01/24/2018   Sleep disturbance 01/24/2018   Decreased libido 12/18/2017   Excessive sweating 12/18/2017   HNP (herniated nucleus pulposus), lumbar 09/17/2017   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    Past Surgical History:  Procedure Laterality Date   COLONOSCOPY WITH PROPOFOL N/A 12/09/2020   Procedure: COLONOSCOPY WITH PROPOFOL;  Surgeon: Lin Landsman, MD;  Location: [facility];  Service: Gastroenterology;  Laterality: N/A;   INNER EAR SURGERY Right    LUMBAR LAMINECTOMY/DECOMPRESSION MICRODISCECTOMY N/A 09/17/2017   Procedure: RIGHT L5-S1 MICRODISCECTOMY;  Surgeon: Marybelle Killings, MD;  Location: [facility];  Service:  Orthopedics;  Laterality: N/A;   STOMACH SURGERY     PYLORIC STENOSIS   TONSILLECTOMY         Home Medications    Prior to Admission medications   Medication Sig Start Date End Date Taking? Authorizing Provider  doxycycline (VIBRAMYCIN) 100 MG capsule Take 1 capsule (100 mg total) by mouth 2 (two) times daily for 7 days. 12/15/21 6/50/35 Yes Kennita Pavlovich, Jeanett Schlein, NP  promethazine-dextromethorphan (PROMETHAZINE-DM) 6.25-15 MG/5ML syrup Take 5 mLs by mouth 4 (four) times daily as needed for cough. 4/65/68  Yes Thaddeus Evitts, Jeanett Schlein, NP  amLODipine (NORVASC) 5 MG tablet Take by mouth. 08/16/20   [provider]  azelastine (OPTIVAR) 0.05 % ophthalmic solution INSTILL 1 DROP INTO AFFECTED EYE TWICE A DAY 12/13/17   [provider]  baclofen (LIORESAL) 10 MG tablet  08/16/20   [provider]  busPIRone (BUSPAR) 10 MG tablet take 3 tablets by oral route every day 08/20/20   [provider]  DULoxetine (CYMBALTA) 30 MG capsule take 3 capsules by oral route  every day 08/20/20   [provider]  EPINEPHrine 0.3 mg/0.3 mL IJ SOAJ injection Inject into the muscle. 08/16/20   [provider]  fluticasone (FLONASE) 50 MCG/ACT nasal spray SPRAY 1 SPRAY INTO EACH NOSTRIL EVERY DAY 12/12/17   [provider]  melatonin 3 MG TABS tablet Take 6 mg by mouth at bedtime.    [provider]  modafinil (PROVIGIL) 100 MG tablet Take by mouth. 08/20/20   [provider]  omeprazole (PRILOSEC OTC) 20 MG tablet Take by mouth.    [provider]  simvastatin (ZOCOR) 10 MG tablet take 1 tablet by oral route  every day    [provider]  traZODone (DESYREL) 150 MG tablet take 1/2 tablet by oral route every day 08/20/20   [provider]    Family History Family History  Problem Relation Age of Onset   Arthritis Mother    Asthma Mother    Depression Mother    COPD Mother    Heart attack Mother    Heart disease  Mother    Hypertension Mother    Alcohol abuse Father    Diabetes Father    Early death Father    Lung cancer Father    Cervical cancer Sister     Social History Social History   Tobacco Use   Smoking status: Every Day    Years: 9.00    Types: Cigarettes   Smokeless tobacco: Never  Vaping Use   Vaping Use: Every day  Substance Use Topics   Alcohol use: Yes    Comment: HX ETOH ABUSE  SOBER >1 YEAR. 11/16/2020 social    Drug use: No     Allergies   Proventil [albuterol], Shellfish allergy, and Sulfa antibiotics   Review of Systems Review of Systems  Constitutional:  Negative for fever.  HENT:  Positive for congestion, ear pain, sinus pressure, sinus pain and sore throat.   Respiratory:  Positive for cough. Negative for shortness of breath and wheezing.   Cardiovascular:  Negative for chest pain and palpitations.  Gastrointestinal:  Negative for abdominal pain.  All other systems reviewed and are negative.    Physical Exam Triage Vital Signs ED Triage Vitals [12/15/21 1135]  Enc Vitals Group     BP (!) 158/80     Pulse Rate 84     Resp 18     Temp 98.3 F (36.8 C)     Temp Source Oral     SpO2 96 %     Weight      Height      Head Circumference      Peak Flow      Pain Score 4     Pain Loc      Pain Edu?      Excl. in [place]?    No data found.  Updated Vital Signs BP (!) 158/80 (BP Location: Left Arm)   Pulse 84   Temp 98.3 F (36.8 C) (Oral)   Resp 18   SpO2 96%   Visual Acuity Right Eye Distance:   Left Eye Distance:   Bilateral Distance:    Right Eye Near:   Left Eye Near:    Bilateral Near:     Physical Exam Vitals and nursing note reviewed.  Constitutional:      General: He is not in acute distress.    Appearance: He is well-developed. He is not ill-appearing or toxic-appearing.  HENT:     Head: Normocephalic.     Right Ear: Tympanic membrane is retracted.     Left Ear: Tympanic membrane is retracted.     Nose: Mucosal edema  present.     Mouth/Throat:     Pharynx: Uvula midline.  Eyes:     General: Lids are normal.     Conjunctiva/sclera: Conjunctivae normal.     Pupils: Pupils are equal, round, and reactive  to light.  Neck:     Trachea: Trachea normal.  Cardiovascular:     Rate and Rhythm: Normal rate and regular rhythm.     Pulses: Normal pulses.     Heart sounds: Normal heart sounds.  Pulmonary:     Effort: Pulmonary effort is normal. No respiratory distress.     Breath sounds: Normal breath sounds and air entry. No decreased breath sounds or wheezing.  Abdominal:     General: There is no distension.     Palpations: Abdomen is soft.  Musculoskeletal:        General: Normal range of motion.     Cervical back: Normal range of motion.  Skin:    General: Skin is warm and dry.     Findings: No rash.  Neurological:     General: No focal deficit present.     Mental Status: He is alert and oriented to person, place, and time.     GCS: GCS eye subscore is 4. GCS verbal subscore is 5. GCS motor subscore is 6.     Cranial Nerves: No cranial nerve deficit.     Sensory: No sensory deficit.  Psychiatric:        Attention and Perception: Attention normal.        Mood and Affect: Mood normal.        Speech: Speech normal.        Behavior: Behavior normal. Behavior is cooperative.      UC Treatments / Results  Labs (all labs ordered are listed, but only abnormal results are displayed) Labs Reviewed  POCT RAPID STREP A, ED / UC    EKG   Radiology No results found.  Procedures Procedures (including critical care time)  Medications Ordered in UC Medications - No data to display  Initial Impression / Assessment and Plan / UC Course  I have reviewed the triage vital signs and the nursing notes.  Pertinent labs & imaging results that were available during my care of the patient were reviewed by me and considered in my medical decision making (see chart for details).     Ddx: Acute URI,  sinusitis, seasonal allergies, smoker Final Clinical Impressions(s) / UC Diagnoses   Final diagnoses:  Acute upper respiratory infection  Smoker  Acute cough     Discharge Instructions      Your strep test is negative. Rest,push fluids, take meds as directed. Take home meds as prescribed. Cough medication may make you sleepy so do not drive or operate machinery while taking med. Follow up with PCP. Stop smoking. Go to Er for chest pain,shortness of breath or worsening of symptoms.     ED Prescriptions     Medication Sig Dispense Auth. Provider   doxycycline (VIBRAMYCIN) 100 MG capsule Take 1 capsule (100 mg total) by mouth 2 (two) times daily for 7 days. 14 capsule Deondra Wigger, NP   promethazine-dextromethorphan (PROMETHAZINE-DM) 6.25-15 MG/5ML syrup Take 5 mLs by mouth 4 (four) times daily as needed for cough. 350 mL Francy Mcilvaine, Jeanett Schlein, NP      PDMP not reviewed this encounter.   Tori Milks, NP 09/38/18 1240

## 2021-12-15 NOTE — Discharge Instructions (Signed)
Your strep test is negative. Rest,push fluids, take meds as directed. Take home meds as prescribed. Cough medication may make you sleepy so do not drive or operate machinery while taking med. Follow up with PCP. Stop smoking. Go to Er for chest pain,shortness of breath or worsening of symptoms.

## 2021-12-15 NOTE — ED Triage Notes (Signed)
Pt c/o cough, nasal congestion, sore throat, ear pain, loss of taste and smell for over a month. States had 2 neg home covid test. Taking OTC meds with no relief.
